# Patient Record
Sex: Female | Born: 1935 | Race: White | Hispanic: No | Smoking: Former smoker
Health system: Southern US, Community
[De-identification: ages and names within clinical notes are randomized; demographics above are authoritative.]

## PROBLEM LIST (undated history)

## (undated) DIAGNOSIS — J449 Chronic obstructive pulmonary disease, unspecified: Secondary | ICD-10-CM

## (undated) DIAGNOSIS — C801 Malignant (primary) neoplasm, unspecified: Secondary | ICD-10-CM

## (undated) HISTORY — PX: ABDOMINAL HYSTERECTOMY: SHX81

---

## 2020-10-25 ENCOUNTER — Emergency Department: Payer: Medicare PPO

## 2020-10-25 ENCOUNTER — Emergency Department
Admission: EM | Admit: 2020-10-25 | Discharge: 2020-10-25 | Disposition: A | Discharge status: Home / Self Care | Payer: Medicare PPO | Attending: Student in an Organized Health Care Education/Training Program | Admitting: Student in an Organized Health Care Education/Training Program

## 2020-10-25 ENCOUNTER — Other Ambulatory Visit: Payer: Self-pay

## 2020-10-25 DIAGNOSIS — R059 Cough, unspecified: Secondary | ICD-10-CM | POA: Insufficient documentation

## 2020-10-25 LAB — CBC WITH DIFFERENTIAL/PLATELET
Abs Immature Granulocytes: 0.05 10*3/uL (ref 0.00–0.07)
Basophils Absolute: 0 10*3/uL (ref 0.0–0.1)
Basophils Relative: 0 %
Eosinophils Absolute: 0 10*3/uL (ref 0.0–0.5)
Eosinophils Relative: 0 %
HCT: 32.3 % — ABNORMAL LOW (ref 36.0–46.0)
Hemoglobin: 10.8 g/dL — ABNORMAL LOW (ref 12.0–15.0)
Immature Granulocytes: 1 %
Lymphocytes Relative: 6 %
Lymphs Abs: 0.5 10*3/uL — ABNORMAL LOW (ref 0.7–4.0)
MCH: 32.1 pg (ref 26.0–34.0)
MCHC: 33.4 g/dL (ref 30.0–36.0)
MCV: 96.1 fL (ref 80.0–100.0)
Monocytes Absolute: 0.2 10*3/uL (ref 0.1–1.0)
Monocytes Relative: 2 %
Neutro Abs: 8.1 10*3/uL — ABNORMAL HIGH (ref 1.7–7.7)
Neutrophils Relative %: 91 %
Platelets: 439 10*3/uL — ABNORMAL HIGH (ref 150–400)
RBC: 3.36 MIL/uL — ABNORMAL LOW (ref 3.87–5.11)
RDW: 12.2 % (ref 11.5–15.5)
WBC: 8.9 10*3/uL (ref 4.0–10.5)
nRBC: 0 % (ref 0.0–0.2)

## 2020-10-25 LAB — COMPREHENSIVE METABOLIC PANEL
ALT: 9 U/L (ref 0–44)
AST: 16 U/L (ref 15–41)
Albumin: 2.9 g/dL — ABNORMAL LOW (ref 3.5–5.0)
Alkaline Phosphatase: 56 U/L (ref 38–126)
Anion gap: 11 (ref 5–15)
BUN: 23 mg/dL (ref 8–23)
CO2: 27 mmol/L (ref 22–32)
Calcium: 9.1 mg/dL (ref 8.9–10.3)
Chloride: 96 mmol/L — ABNORMAL LOW (ref 98–111)
Creatinine, Ser: 0.89 mg/dL (ref 0.44–1.00)
GFR, Estimated: >60 mL/min (ref >60)
Glucose, Bld: 155 mg/dL — ABNORMAL HIGH (ref 70–99)
Potassium: 4.4 mmol/L (ref 3.5–5.1)
Sodium: 134 mmol/L — ABNORMAL LOW (ref 135–145)
Total Bilirubin: 0.8 mg/dL (ref 0.3–1.2)
Total Protein: 7.6 g/dL (ref 6.5–8.1)

## 2020-10-25 LAB — URINALYSIS, COMPLETE (UACMP) WITH MICROSCOPIC
Bacteria, UA: NONE SEEN
Bilirubin Urine: NEGATIVE
Glucose, UA: NEGATIVE mg/dL
Hgb urine dipstick: NEGATIVE
Ketones, ur: 5 mg/dL — AB
Leukocytes,Ua: NEGATIVE
Nitrite: NEGATIVE
Protein, ur: NEGATIVE mg/dL
Specific Gravity, Urine: 1.01 (ref 1.005–1.030)
pH: 7 (ref 5.0–8.0)

## 2020-10-25 MED ORDER — SODIUM CHLORIDE 0.9 % IV BOLUS
500.0000 mL | Freq: Once | INTRAVENOUS | Status: AC
  Administered 2020-10-25: 500 mL via INTRAVENOUS

## 2020-10-25 NOTE — ED Notes (Signed)
Pt sitting in recliner chair between triage rooms. Pt states "why am I still here, why haven't I seen a doctor yet". Pt on oxygen, pt has approx 3/4 tank of oxygen left in tank. Pt updated on wait time and informed that she is currently waiting for a room to become available to be seen by MD

## 2020-10-25 NOTE — ED Triage Notes (Signed)
Pt to ED via [facility] from [place], per EMS report pt son is not happy with facility she is at and he is concerned that she is malnourished. Pt was recently given antibiotics due to cough, unsure of where she is in the abx course. Pt reports that she is dehydrated and needs fluids. Pt is c/o being cold. VSS at this time. Pt is A & O x 3

## 2020-10-25 NOTE — Discharge Instructions (Signed)
Please follow-up with your PCP in the next week.  If unable to be transported to PCP patient should be seen by doctors making house calls with family present discuss her medications and care.  Please return to the ER if you have any worsening symptoms any additional questions or concerns.

## 2020-10-25 NOTE — ED Provider Notes (Signed)
[facility] Emergency Department Provider Note    First MD Initiated Contact with Patient 10/25/20 1930     (approximate)  I have reviewed the triage vital signs and the nursing notes.   HISTORY  Chief Complaint Cough    HPI Tara Middleton is a 86 y.o. female presents to the ER for evaluation of generalized malaise and cough as well as wants to be evaluated for some skin breakdown in her upper buttock.  She is coming from facility there is some report by EMS that the family is unhappy with her facility currently.  She denies any worsening shortness of breath.  States that she was recently put on antibiotics for recent cough.  Denies any fevers.  Denies any chest pain.  Feels like her breathing is roughly at his baseline.  Denies any abdominal pain.  Does feel like she is dehydrated and has had decreased p.o. intake.  Denies any abdominal pain.    No past medical history on file. No family history on file.  There are no problems to display for this patient.     Prior to Admission medications   Not on File    Allergies Amoxicillin-pot clavulanate, Cefaclor, Cefuroxime sodium-nacl, Clindamycin/lincomycin, Codeine, Dicloxacillin, Lisinopril, Nabumetone, Promethazine, Remeron [mirtazapine], and Sulfa antibiotics    Social History Social History   Tobacco Use  . Smoking status: Not on file  Substance Use Topics  . Alcohol use: Not on file  . Drug use: Not on file    Review of Systems Patient denies headaches, rhinorrhea, blurry vision, numbness, shortness of breath, chest pain, edema, cough, abdominal pain, nausea, vomiting, diarrhea, dysuria, fevers, rashes or hallucinations unless otherwise stated above in HPI. ____________________________________________   PHYSICAL EXAM:  VITAL SIGNS: Vitals:   10/25/20 1441  BP: (!) 136/50  Pulse: 73  Resp: 16  Temp: 98.5 F (36.9 C)  SpO2: 96%    Constitutional: Alert and oriented. Frail  appearing, in NAD Eyes: Conjunctivae are normal.  Head: Atraumatic. Nose: No congestion/rhinnorhea. Mouth/Throat: Mucous membranes are moist.   Neck: No stridor. Painless ROM.  Cardiovascular: Normal rate, regular rhythm. Grossly normal heart sounds.  Good peripheral circulation. Respiratory: Normal respiratory effort.  No retractions. Lungs with coarse bs throughout Gastrointestinal: Soft and nontender. No distention. No abdominal bruits. No CVA tenderness. Genitourinary:  Musculoskeletal: stage sacral decubitus ulcer.  No lower extremity tenderness nor edema.  No joint effusions. Neurologic:  Normal speech and language. No gross focal neurologic deficits are appreciated. No facial droop Skin:  Skin is warm, dry and intact. No rash noted. Psychiatric: Mood and affect are normal. Speech and behavior are normal.  ____________________________________________   LABS (all labs ordered are listed, but only abnormal results are displayed)  Results for orders placed or performed during the hospital encounter of 10/25/20 (from the past 24 hour(s))  CBC with Differential     Status: Abnormal   Collection Time: 10/25/20  2:48 PM  Result Value Ref Range   WBC 8.9 4.0 - 10.5 K/uL   RBC 3.36 (L) 3.87 - 5.11 MIL/uL   Hemoglobin 10.8 (L) 12.0 - 15.0 g/dL   HCT 32.3 (L) 36 - 46 %   MCV 96.1 80.0 - 100.0 fL   MCH 32.1 26.0 - 34.0 pg   MCHC 33.4 30.0 - 36.0 g/dL   RDW 12.2 11.5 - 15.5 %   Platelets 439 (H) 150 - 400 K/uL   nRBC 0.0 0.0 - 0.2 %   Neutrophils Relative % 91 %  Neutro Abs 8.1 (H) 1.7 - 7.7 K/uL   Lymphocytes Relative 6 %   Lymphs Abs 0.5 (L) 0.7 - 4.0 K/uL   Monocytes Relative 2 %   Monocytes Absolute 0.2 0.1 - 1.0 K/uL   Eosinophils Relative 0 %   Eosinophils Absolute 0.0 0.0 - 0.5 K/uL   Basophils Relative 0 %   Basophils Absolute 0.0 0.0 - 0.1 K/uL   Immature Granulocytes 1 %   Abs Immature Granulocytes 0.05 0.00 - 0.07 K/uL  Comprehensive metabolic panel     Status:  Abnormal   Collection Time: 10/25/20  2:48 PM  Result Value Ref Range   Sodium 134 (L) 135 - 145 mmol/L   Potassium 4.4 3.5 - 5.1 mmol/L   Chloride 96 (L) 98 - 111 mmol/L   CO2 27 22 - 32 mmol/L   Glucose, Bld 155 (H) 70 - 99 mg/dL   BUN 23 8 - 23 mg/dL   Creatinine, Ser 0.89 0.44 - 1.00 mg/dL   Calcium 9.1 8.9 - 10.3 mg/dL   Total Protein 7.6 6.5 - 8.1 g/dL   Albumin 2.9 (L) 3.5 - 5.0 g/dL   AST 16 15 - 41 U/L   ALT 9 0 - 44 U/L   Alkaline Phosphatase 56 38 - 126 U/L   Total Bilirubin 0.8 0.3 - 1.2 mg/dL   GFR, Estimated >60 >60 mL/min   Anion gap 11 5 - 15   ____________________________________________  EKG My review and personal interpretation at Time: 14:32   Indication: cough  Rate: 70  Rhythm: sinus Axis: normal Other: normal intervals, no stemi ____________________________________________  RADIOLOGY  I personally reviewed all radiographic images ordered to evaluate for the above acute complaints and reviewed radiology reports and findings.  These findings were personally discussed with the patient.  Please see medical record for radiology report.  ____________________________________________   PROCEDURES  Procedure(s) performed:  Procedures    Critical Care performed: no ____________________________________________   INITIAL IMPRESSION / ASSESSMENT AND PLAN / ED COURSE  Pertinent labs & imaging results that were available during my care of the patient were reviewed by me and considered in my medical decision making (see chart for details).   DDX: pna, copd, chf, electrolyte abn, dehydration, ulcer  Tara Middleton is a 86 y.o. who presents to the ED with presentation as described above.  Patient is nontoxic-appearing.  Afebrile no hypoxia on her home O2.  Blood work was sent for the above differential order x-rays.  She not complain of any abdominal pain.  No recent falls.  Family is concerned about some increasing anxiousness as well as episodes of possible  confusion the past several weeks.  Will check urinalysis.  Does not seem to be having COPD exacerbation.  May be related to polypharmacy as she is on multiple medications including pain medications.  Clinical Course as of Oct 26 13  Fri Oct 25, 2020  2145 Patient has no abdominal pain.  No nausea or vomiting.  Suspect gaseous distention not consistent with ileus not consistent with obstruction.  Family is reporting some increasing confusion particularly at night is reporting that she has been on chronic pain medication but also stating the patient becomes very anxious at night.   [PR]  2207 Patient reassessed.  Otherwise feels well after IV fluids.  Tolerating p.o.  I discussed with family it seems like she has had some decline since no longer getting PT.  Will place consult for case management but I do not see indication  for hospitalization at this time.  Patient appropriate for close outpatient follow-up.   [PR]    Clinical Course User Index [PR] Merlyn Lot, MD    The patient was evaluated in Emergency Department today for the symptoms described in the history of present illness. He/she was evaluated in the context of the global COVID-19 pandemic, which necessitated consideration that the patient might be at risk for infection with the SARS-CoV-2 virus that causes COVID-19. Institutional protocols and algorithms that pertain to the evaluation of patients at risk for COVID-19 are in a state of rapid change based on information released by regulatory bodies including the CDC and federal and state organizations. These policies and algorithms were followed during the patient's care in the ED.  As part of my medical decision making, I reviewed the following data within the [place] notes reviewed and incorporated, Labs reviewed, notes from prior ED visits and [place] Controlled Substance Database   ____________________________________________   FINAL CLINICAL  IMPRESSION(S) / ED DIAGNOSES  Final diagnoses:  Cough      NEW MEDICATIONS STARTED DURING THIS VISIT:  New Prescriptions   No medications on file     Note:  This document was prepared using Dragon voice recognition software and may include unintentional dictation errors.    Merlyn Lot, MD 10/26/20 [id number]

## 2020-10-25 NOTE — ED Notes (Addendum)
Tara Middleton at [facility] [employer] [facility] [employer] [facility] contacted at [phone].  Advised that ER MD want PT initiated, wound care initiated and within 48 hours a family meeting with the doctor to discuss medications.  Tara Middleton verbalized understanding and had no further questions

## 2020-10-28 NOTE — ED Notes (Signed)
Called [facility] and informed Tara Middleton that chest xray shows nodular density and the recommendation for follow up chest xray in 4-6 weeks and instructed her to notify patient's physician.

## 2020-11-08 ENCOUNTER — Inpatient Hospital Stay
Admission: EM | Admit: 2020-11-08 | Discharge: 2020-11-10 | DRG: 603 | Disposition: A | Discharge status: Skilled Nursing Facility | Payer: Medicare Other | Source: Skilled Nursing Facility | Attending: Internal Medicine | Admitting: Internal Medicine

## 2020-11-08 ENCOUNTER — Other Ambulatory Visit: Payer: Self-pay

## 2020-11-08 DIAGNOSIS — L03115 Cellulitis of right lower limb: Secondary | ICD-10-CM | POA: Diagnosis not present

## 2020-11-08 DIAGNOSIS — Z9012 Acquired absence of left breast and nipple: Secondary | ICD-10-CM

## 2020-11-08 DIAGNOSIS — J9610 Chronic respiratory failure, unspecified whether with hypoxia or hypercapnia: Secondary | ICD-10-CM | POA: Diagnosis not present

## 2020-11-08 DIAGNOSIS — Z882 Allergy status to sulfonamides status: Secondary | ICD-10-CM

## 2020-11-08 DIAGNOSIS — L89311 Pressure ulcer of right buttock, stage 1: Secondary | ICD-10-CM | POA: Diagnosis present

## 2020-11-08 DIAGNOSIS — Z853 Personal history of malignant neoplasm of breast: Secondary | ICD-10-CM | POA: Diagnosis not present

## 2020-11-08 DIAGNOSIS — L89321 Pressure ulcer of left buttock, stage 1: Secondary | ICD-10-CM | POA: Diagnosis present

## 2020-11-08 DIAGNOSIS — L03116 Cellulitis of left lower limb: Secondary | ICD-10-CM | POA: Diagnosis not present

## 2020-11-08 DIAGNOSIS — Z9981 Dependence on supplemental oxygen: Secondary | ICD-10-CM

## 2020-11-08 DIAGNOSIS — Z947 Corneal transplant status: Secondary | ICD-10-CM

## 2020-11-08 DIAGNOSIS — Z888 Allergy status to other drugs, medicaments and biological substances status: Secondary | ICD-10-CM | POA: Diagnosis not present

## 2020-11-08 DIAGNOSIS — Z87891 Personal history of nicotine dependence: Secondary | ICD-10-CM | POA: Diagnosis not present

## 2020-11-08 DIAGNOSIS — J449 Chronic obstructive pulmonary disease, unspecified: Secondary | ICD-10-CM | POA: Diagnosis not present

## 2020-11-08 DIAGNOSIS — Z881 Allergy status to other antibiotic agents status: Secondary | ICD-10-CM | POA: Diagnosis not present

## 2020-11-08 DIAGNOSIS — L03119 Cellulitis of unspecified part of limb: Secondary | ICD-10-CM | POA: Diagnosis present

## 2020-11-08 DIAGNOSIS — Z885 Allergy status to narcotic agent status: Secondary | ICD-10-CM

## 2020-11-08 DIAGNOSIS — Z20822 Contact with and (suspected) exposure to covid-19: Secondary | ICD-10-CM | POA: Diagnosis not present

## 2020-11-08 DIAGNOSIS — I10 Essential (primary) hypertension: Secondary | ICD-10-CM | POA: Diagnosis present

## 2020-11-08 DIAGNOSIS — L8990 Pressure ulcer of unspecified site, unspecified stage: Secondary | ICD-10-CM | POA: Insufficient documentation

## 2020-11-08 HISTORY — DX: Chronic obstructive pulmonary disease, unspecified: J44.9

## 2020-11-08 HISTORY — DX: Malignant (primary) neoplasm, unspecified: C80.1

## 2020-11-08 LAB — CBC WITH DIFFERENTIAL/PLATELET
Abs Immature Granulocytes: 0.02 10*3/uL (ref 0.00–0.07)
Basophils Absolute: 0.1 10*3/uL (ref 0.0–0.1)
Basophils Relative: 1 %
Eosinophils Absolute: 0.1 10*3/uL (ref 0.0–0.5)
Eosinophils Relative: 2 %
HCT: 33.3 % — ABNORMAL LOW (ref 36.0–46.0)
Hemoglobin: 10.9 g/dL — ABNORMAL LOW (ref 12.0–15.0)
Immature Granulocytes: 0 %
Lymphocytes Relative: 13 %
Lymphs Abs: 0.9 10*3/uL (ref 0.7–4.0)
MCH: 32 pg (ref 26.0–34.0)
MCHC: 32.7 g/dL (ref 30.0–36.0)
MCV: 97.7 fL (ref 80.0–100.0)
Monocytes Absolute: 0.5 10*3/uL (ref 0.1–1.0)
Monocytes Relative: 7 %
Neutro Abs: 5.2 10*3/uL (ref 1.7–7.7)
Neutrophils Relative %: 77 %
Platelets: 322 10*3/uL (ref 150–400)
RBC: 3.41 MIL/uL — ABNORMAL LOW (ref 3.87–5.11)
RDW: 13.6 % (ref 11.5–15.5)
WBC: 6.8 10*3/uL (ref 4.0–10.5)
nRBC: 0 % (ref 0.0–0.2)

## 2020-11-08 LAB — COMPREHENSIVE METABOLIC PANEL
ALT: 9 U/L (ref 0–44)
AST: 18 U/L (ref 15–41)
Albumin: 3 g/dL — ABNORMAL LOW (ref 3.5–5.0)
Alkaline Phosphatase: 68 U/L (ref 38–126)
Anion gap: 11 (ref 5–15)
BUN: 32 mg/dL — ABNORMAL HIGH (ref 8–23)
CO2: 25 mmol/L (ref 22–32)
Calcium: 8.8 mg/dL — ABNORMAL LOW (ref 8.9–10.3)
Chloride: 102 mmol/L (ref 98–111)
Creatinine, Ser: 0.65 mg/dL (ref 0.44–1.00)
GFR, Estimated: >60 mL/min (ref >60)
Glucose, Bld: 108 mg/dL — ABNORMAL HIGH (ref 70–99)
Potassium: 4.2 mmol/L (ref 3.5–5.1)
Sodium: 138 mmol/L (ref 135–145)
Total Bilirubin: 0.7 mg/dL (ref 0.3–1.2)
Total Protein: 7.1 g/dL (ref 6.5–8.1)

## 2020-11-08 LAB — RESP PANEL BY RT-PCR (FLU A&B, COVID) ARPGX2
Influenza A by PCR: NEGATIVE
Influenza B by PCR: NEGATIVE
SARS Coronavirus 2 by RT PCR: NEGATIVE

## 2020-11-08 LAB — PROTIME-INR
INR: 0.9 (ref 0.8–1.2)
Prothrombin Time: 12 seconds (ref 11.4–15.2)

## 2020-11-08 LAB — APTT: aPTT: 34 seconds (ref 24–36)

## 2020-11-08 MED ORDER — SODIUM CHLORIDE 0.9 % IV SOLN
1.0000 g | Freq: Two times a day (BID) | INTRAVENOUS | Status: DC
  Administered 2020-11-09: 08:00:00 1 g via INTRAVENOUS
  Filled 2020-11-08 (×2): qty 1

## 2020-11-08 MED ORDER — PANTOPRAZOLE SODIUM 40 MG PO TBEC
40.0000 mg | DELAYED_RELEASE_TABLET | Freq: Every day | ORAL | Status: DC
  Administered 2020-11-09 – 2020-11-10 (×2): 40 mg via ORAL
  Filled 2020-11-08 (×2): qty 1

## 2020-11-08 MED ORDER — LEVOTHYROXINE SODIUM 112 MCG PO TABS: 112.0000 ug | ORAL_TABLET | Freq: Every day | ORAL | Status: DC

## 2020-11-08 MED ORDER — TIOTROPIUM BROMIDE MONOHYDRATE 2.5 MCG/ACT IN AERS: 2.0000 | INHALATION_SPRAY | Freq: Every day | RESPIRATORY_TRACT | Status: DC

## 2020-11-08 MED ORDER — VITAMIN D3 25 MCG (1000 UNIT) PO TABS
1000.0000 [IU] | ORAL_TABLET | Freq: Every day | ORAL | Status: DC
  Administered 2020-11-09 – 2020-11-10 (×2): 1000 [IU] via ORAL
  Filled 2020-11-08 (×4): qty 1

## 2020-11-08 MED ORDER — VANCOMYCIN HCL IN DEXTROSE 1-5 GM/200ML-% IV SOLN: 1000.0000 mg | Freq: Once | INTRAVENOUS | Status: DC

## 2020-11-08 MED ORDER — VANCOMYCIN HCL 1750 MG/350ML IV SOLN
1750.0000 mg | Freq: Once | INTRAVENOUS | Status: AC
  Administered 2020-11-08: 20:00:00 1750 mg via INTRAVENOUS
  Filled 2020-11-08: qty 350

## 2020-11-08 MED ORDER — ACIDOPHILUS PO TABS: 1.0000 | ORAL_TABLET | Freq: Two times a day (BID) | ORAL | Status: DC

## 2020-11-08 MED ORDER — ALBUTEROL SULFATE 0.63 MG/3ML IN NEBU: 3.0000 mL | INHALATION_SOLUTION | Freq: Three times a day (TID) | RESPIRATORY_TRACT | Status: DC | PRN

## 2020-11-08 MED ORDER — LOTEPREDNOL ETABONATE 0.5 % OP SUSP
1.0000 [drp] | Freq: Every day | OPHTHALMIC | Status: DC
  Administered 2020-11-10: 08:00:00 1 [drp] via OPHTHALMIC
  Filled 2020-11-08: qty 5

## 2020-11-08 MED ORDER — ALBUTEROL SULFATE (2.5 MG/3ML) 0.083% IN NEBU
0.6300 mg | INHALATION_SOLUTION | Freq: Three times a day (TID) | RESPIRATORY_TRACT | Status: DC | PRN
  Filled 2020-11-08: qty 3

## 2020-11-08 MED ORDER — AMLODIPINE BESYLATE 5 MG PO TABS
5.0000 mg | ORAL_TABLET | Freq: Every day | ORAL | Status: DC
  Administered 2020-11-09 – 2020-11-10 (×2): 5 mg via ORAL
  Filled 2020-11-08 (×2): qty 1

## 2020-11-08 MED ORDER — HYDROCODONE-ACETAMINOPHEN 5-325 MG PO TABS
1.0000 | ORAL_TABLET | Freq: Four times a day (QID) | ORAL | Status: DC | PRN
  Administered 2020-11-09 – 2020-11-10 (×2): 1 via ORAL
  Filled 2020-11-08 (×2): qty 1

## 2020-11-08 MED ORDER — UMECLIDINIUM BROMIDE 62.5 MCG/INH IN AEPB
1.0000 | INHALATION_SPRAY | Freq: Every day | RESPIRATORY_TRACT | Status: DC
  Administered 2020-11-10: 08:00:00 1 via RESPIRATORY_TRACT
  Filled 2020-11-08: qty 7

## 2020-11-08 MED ORDER — BUDESONIDE 0.5 MG/2ML IN SUSP
0.5000 mg | Freq: Two times a day (BID) | RESPIRATORY_TRACT | Status: DC
  Administered 2020-11-09 – 2020-11-10 (×3): 0.5 mg via RESPIRATORY_TRACT
  Filled 2020-11-08 (×2): qty 2

## 2020-11-08 MED ORDER — SODIUM CHLORIDE 0.9 % IV SOLN: 250.0000 mL | INTRAVENOUS | Status: DC | PRN

## 2020-11-08 MED ORDER — LIQUACEL PO LIQD: 30.0000 mL | Freq: Two times a day (BID) | ORAL | Status: DC

## 2020-11-08 MED ORDER — SODIUM CHLORIDE 0.9% FLUSH
3.0000 mL | Freq: Two times a day (BID) | INTRAVENOUS | Status: DC
  Administered 2020-11-09 – 2020-11-10 (×3): 3 mL via INTRAVENOUS

## 2020-11-08 MED ORDER — SODIUM CHLORIDE 0.9 % IV SOLN
1.0000 g | Freq: Once | INTRAVENOUS | Status: AC
  Administered 2020-11-09: 03:00:00 1 g via INTRAVENOUS
  Filled 2020-11-08: qty 1

## 2020-11-08 MED ORDER — RISAQUAD PO CAPS
1.0000 | ORAL_CAPSULE | Freq: Every day | ORAL | Status: DC
  Administered 2020-11-09 – 2020-11-10 (×2): 1 via ORAL
  Filled 2020-11-08 (×2): qty 1

## 2020-11-08 MED ORDER — MAGNESIUM OXIDE 400 MG PO TABS
400.0000 mg | ORAL_TABLET | Freq: Every day | ORAL | Status: DC
  Administered 2020-11-09 – 2020-11-10 (×2): 400 mg via ORAL
  Filled 2020-11-08 (×4): qty 1

## 2020-11-08 MED ORDER — POLYETHYLENE GLYCOL 3350 17 G PO PACK
17.0000 g | PACK | Freq: Every day | ORAL | Status: DC
  Administered 2020-11-09 – 2020-11-10 (×2): 17 g via ORAL
  Filled 2020-11-08 (×2): qty 1

## 2020-11-08 MED ORDER — DORZOLAMIDE HCL 2 % OP SOLN
1.0000 [drp] | Freq: Two times a day (BID) | OPHTHALMIC | Status: DC
  Administered 2020-11-09: 08:00:00 1 [drp] via OPHTHALMIC
  Filled 2020-11-08 (×2): qty 10

## 2020-11-08 MED ORDER — TIMOLOL MALEATE 0.5 % OP SOLN
1.0000 [drp] | Freq: Two times a day (BID) | OPHTHALMIC | Status: DC
  Administered 2020-11-09 – 2020-11-10 (×3): 1 [drp] via OPHTHALMIC
  Filled 2020-11-08 (×2): qty 5

## 2020-11-08 MED ORDER — POLYETHYLENE GLYCOL 3350 17 GM/SCOOP PO POWD: 17.0000 g | Freq: Every day | ORAL | Status: DC

## 2020-11-08 MED ORDER — DORZOLAMIDE HCL-TIMOLOL MAL 2-0.5 % OP SOLN: 1.0000 [drp] | Freq: Two times a day (BID) | OPHTHALMIC | Status: DC

## 2020-11-08 MED ORDER — SODIUM CHLORIDE 0.9% FLUSH: 3.0000 mL | INTRAVENOUS | Status: DC | PRN

## 2020-11-08 MED ORDER — URSODIOL 300 MG PO CAPS
300.0000 mg | ORAL_CAPSULE | Freq: Two times a day (BID) | ORAL | Status: DC
  Administered 2020-11-09 – 2020-11-10 (×3): 300 mg via ORAL
  Filled 2020-11-08 (×6): qty 1

## 2020-11-08 MED ORDER — VANCOMYCIN HCL 1500 MG/300ML IV SOLN
1500.0000 mg | INTRAVENOUS | Status: DC
  Filled 2020-11-08: qty 300

## 2020-11-08 MED ORDER — FLUTICASONE PROPIONATE HFA 220 MCG/ACT IN AERO: 2.0000 | INHALATION_SPRAY | Freq: Two times a day (BID) | RESPIRATORY_TRACT | Status: DC

## 2020-11-08 MED ORDER — LATANOPROST 0.005 % OP SOLN
1.0000 [drp] | Freq: Every day | OPHTHALMIC | Status: DC
  Administered 2020-11-09: 21:00:00 1 [drp] via OPHTHALMIC
  Filled 2020-11-08 (×2): qty 2.5

## 2020-11-08 MED ORDER — ENOXAPARIN SODIUM 40 MG/0.4ML ~~LOC~~ SOLN: 40.0000 mg | SUBCUTANEOUS | Status: DC

## 2020-11-08 MED ORDER — HYDROCODONE-ACETAMINOPHEN 5-325 MG PO TABS
1.0000 | ORAL_TABLET | Freq: Once | ORAL | Status: AC
  Administered 2020-11-08: 20:00:00 1 via ORAL
  Filled 2020-11-08: qty 1

## 2020-11-08 NOTE — ED Triage Notes (Signed)
First RN Note: Pt to ED via [facility] with c/o blisters that are filled with blood to L inner thigh. Per EMS pt with redness to L inner thigh down to L calf with 2 very large blisters noted to inner calf.   Per EMS pt wih no use to L arm due to hx of cancer.

## 2020-11-08 NOTE — Progress Notes (Signed)
Pharmacy Antibiotic Note  Tara Middleton is a 86 y.o. female admitted on 11/08/2020 with cellulitis.  Pharmacy has been consulted for Meropenem and Vancomycin dosing.  Plan:  Vancomycin 1750mg  IV bolus followed by 1500mg  IV q24h  Meropenem 1g IV q12h  Vancomycin trough at steady state  Follow renal function  Height: 5\' 3"  (160 cm) Weight: 84.4 kg (186 lb) IBW/kg (Calculated) : 52.4  Temp (24hrs), Avg:98.3 F (36.8 C), Min:98.3 F (36.8 C), Max:98.3 F (36.8 C)  Recent Labs  Lab 11/08/20 1309  WBC 6.8  CREATININE 0.65    Estimated Creatinine Clearance: 49.1 mL/min (by C-G formula based on SCr of 0.65 mg/dL).    Allergies  Allergen Reactions  . Amoxicillin-Pot Clavulanate   . Cefaclor   . Cefuroxime Sodium-Nacl   . Clindamycin/Lincomycin   . Codeine   . Dicloxacillin   . Lisinopril   . Nabumetone   . Promethazine   . Remeron [Mirtazapine]   . Sulfa Antibiotics     Antimicrobials this admission: Meropenem 12/17 >>  Vancomycin 12/17 >>   Dose adjustments this admission:  Microbiology results:  Thank you for allowing pharmacy to be a part of this patient's care.  Paulina Fusi, PharmD, BCPS 11/08/2020 9:33 PM

## 2020-11-08 NOTE — ED Provider Notes (Signed)
[facility] Emergency Department Provider Note   ____________________________________________   Event Date/Time   First MD Initiated Contact with Patient 11/08/20 1755     (approximate)  I have reviewed the triage vital signs and the nursing notes.   HISTORY  Chief Complaint Leg Pain    HPI Tara Middleton is a 86 y.o. female history of COPD  Patient presents today, reports about 1 week having pain in her lower legs with bruising.  This is developed into blistering and redness and is very tender to the touch in both lower legs.  No rashes or bruising or blistering elsewhere.  No new medications  Reports it is very painful treating with hydrocodone   Pain is mild at rest but severe if she touches the skin.  Is very red.  No nausea vomiting no fevers.  No chills.  She does walk but is painful with anything that touches her lower legs  No cough or shortness of breath.  Past Medical History:  Diagnosis Date  . Cancer ([place])   . COPD (chronic obstructive pulmonary disease) [facility])     Patient Active Problem List   Diagnosis Date Noted  . Cellulitis of lower extremity 11/08/2020    Past Surgical History:  Procedure Laterality Date  . ABDOMINAL HYSTERECTOMY      Prior to Admission medications   Not on File    Allergies Amoxicillin-pot clavulanate, Cefaclor, Cefuroxime sodium-nacl, Clindamycin/lincomycin, Codeine, Dicloxacillin, Lisinopril, Nabumetone, Promethazine, Remeron [mirtazapine], and Sulfa antibiotics  History reviewed. No pertinent family history.  Social History Social History   Tobacco Use  . Smoking status: Former Smoker  Substance Use Topics  . Alcohol use: Never  . Drug use: Never    Review of Systems Constitutional: No fever/chills Eyes: No visual changes. ENT: No sore throat. Cardiovascular: Denies chest pain. Respiratory: Denies shortness of breath. Gastrointestinal: No abdominal pain.   Genitourinary: Negative for  dysuria. Musculoskeletal: Negative for back pain. Skin: See HPI Neurological: Negative for headaches.  No numbness weakness or cold or blue feet bilateral    ____________________________________________   PHYSICAL EXAM:  VITAL SIGNS: ED Triage Vitals  Enc Vitals Group     BP 11/08/20 1304 (!) 109/54     Pulse Rate 11/08/20 1304 65     Resp 11/08/20 1304 18     Temp 11/08/20 1304 98.3 F (36.8 C)     Temp Source 11/08/20 1304 Oral     SpO2 11/08/20 1304 100 %     Weight 11/08/20 1305 186 lb (84.4 kg)     Height 11/08/20 1305 5\' 3"  (1.6 m)     Head Circumference --      Peak Flow --      Pain Score 11/08/20 1304 0     Pain Loc --      Pain Edu? --      Excl. in [place]? --     Constitutional: Alert and oriented.  Very frail and chronically ill appearance Eyes: Conjunctivae are normal. Head: Atraumatic. Nose: No congestion/rhinnorhea. Mouth/Throat: Mucous membranes are moist. Neck: No stridor.  Cardiovascular: Normal rate, regular rhythm. Grossly normal heart sounds.  Good peripheral circulation. Respiratory: Normal respiratory effort.  No retractions. Lungs CTAB. Gastrointestinal: Soft and nontender. No distention. Musculoskeletal: Bilateral lower extremity redness with somewhat bolus venous blistering and small patches over the lower legs bilaterally with one fairly large blister over the left posterior calf.  She has surrounding erythema warmth and tenderness to touch in both lower extremities.  There are no open blisters or ulcerations.  Skin rashes seem to be isolated between the ankles and knees bilaterally without extension to other areas or extremities.  Strong dorsalis pedis and posterior tibial pulses bilateral.  Normal sensation and toe wiggle bilateral Neurologic:  Normal speech and language. No gross focal neurologic deficits are appreciated.  Skin:  Skin is warm, dry and intact.  See above under musculoskeletal psychiatric: Mood and affect are normal. Speech and  behavior are normal.  ____________________________________________   LABS (all labs ordered are listed, but only abnormal results are displayed)  Labs Reviewed  CBC WITH DIFFERENTIAL/PLATELET - Abnormal; Notable for the following components:      Result Value   RBC 3.41 (*)    Hemoglobin 10.9 (*)    HCT 33.3 (*)    All other components within normal limits  COMPREHENSIVE METABOLIC PANEL - Abnormal; Notable for the following components:   Glucose, Bld 108 (*)    BUN 32 (*)    Calcium 8.8 (*)    Albumin 3.0 (*)    All other components within normal limits  CULTURE, BLOOD (ROUTINE X 2)  CULTURE, BLOOD (ROUTINE X 2)  APTT  PROTIME-INR   ____________________________________________  EKG   ____________________________________________  RADIOLOGY   ____________________________________________   PROCEDURES  Procedure(s) performed: None  Procedures  Critical Care performed: No  ____________________________________________   INITIAL IMPRESSION / ASSESSMENT AND PLAN / ED COURSE  Pertinent labs & imaging results that were available during my care of the patient were reviewed by me and considered in my medical decision making (see chart for details).   Patient presents with venous appearing blistering, possible venous congestion with associated skin blistering unclear from microtrauma or other etiology such as inflammatory or immune etiology though given tenderness warmth and surrounding erythema I favor venous congestion with associated bilateral cellulitic changes.  Discussed with the patient and family will monitor order wound consult and start the patient on IV vancomycin  Admission discussed with Dr. Francine Graven.  Patient and family agreeable.  Had last took hydrocodone in her facility about 6 to 8 hours ago and would like another dose which is effective for pain relief.  Tara Middleton was evaluated in Emergency Department on 11/08/2020 for the symptoms described in the  history of present illness. She was evaluated in the context of the global COVID-19 pandemic, which necessitated consideration that the patient might be at risk for infection with the SARS-CoV-2 virus that causes COVID-19. Institutional protocols and algorithms that pertain to the evaluation of patients at risk for COVID-19 are in a state of rapid change based on information released by regulatory bodies including the CDC and federal and state organizations. These policies and algorithms were followed during the patient's care in the ED.       ____________________________________________   FINAL CLINICAL IMPRESSION(S) / ED DIAGNOSES  Final diagnoses:  Cellulitis of lower extremity, unspecified laterality        Note:  This document was prepared using Dragon voice recognition software and may include unintentional dictation errors       Delman Kitten, MD 11/08/20 1846

## 2020-11-08 NOTE — ED Triage Notes (Signed)
Pt here via [facility] from [facility].  Pt with c/o blood filled blisters to bilateral lower extremities. Redness and swelling present down L inner thigh to L calf with apple sized blister present to medial inner calf. No drainage. Pt reports pain with ambulation, extremely sensitive to the touch.

## 2020-11-08 NOTE — Progress Notes (Signed)
PHARMACY -  BRIEF ANTIBIOTIC NOTE   Pharmacy has received consult(s) for Vancomycin from an ED provider.  The patient's profile has been reviewed for ht/wt/allergies/indication/available labs.    One time order(s) placed for Vancomycin 1750mg    Further antibiotics/pharmacy consults should be ordered by admitting physician if indicated.                       Thank you, Vira Blanco 11/08/2020  6:27 PM

## 2020-11-08 NOTE — H&P (Signed)
History and Physical    Tara Middleton GXQ:[id number] DOB: [birth year] DOA: 11/08/2020  PCP: Patient, No Pcp Per   Patient coming from: Skilled nursing facility   I have personally briefly reviewed patient's old medical records in [place]  Chief Complaint: Bilateral lower extremity pain  HPI: Tara Middleton is a 86 y.o. female with medical history significant for COPD with chronic respiratory failure, history of breast cancer status post left mastectomy who presents to the emergency room via EMS for evaluation of pain in both legs.  Patient states that she has had pain in both lower extremities for months but recently started developing blisters on both legs and over the last several days she noticed that the blisters contained blood.  Blisters are painful and are very tender to touch.  She denies any trauma, denies any recent fall, no recent medication use or change in lotions/cream.  She denies having any fever or chills, no dizziness, no lightheadedness, no chest pain, no shortness of breath, no palpitations, no diaphoresis, no abdominal pain, no nausea, no vomiting, no changes in her bowel habits, no cough, no headache or any mental status changes. Her daughter-in-law who is at the bedside states that she has had very poor oral intake and has lost a lot of weight, over 20 pounds in the last couple of months. Labs show sodium 138, potassium 4.2, chloride 102, bicarb 25, glucose 108, BUN 32, creatinine 0.65 calcium 8.8, alkaline phosphatase 68, albumin 3.0, AST 18, ALT nine, total protein 7.1, white count 6.8, hemoglobin 10.9, hematocrit 33.3, MCV 97.7, RDW 13.6, platelet count 322, PT 12.0, INR 0.9 Urinalysis is sterile     ED Course: Patient is an 86 year old Caucasian female who presents to the emergency room for evaluation of pain in both lower extremities.  She is noted to have  bullous lesion on both lower extremities that are very tender to touch.  She has redness over both lower  extremities with differential warmth and received IV antibiotics in the ER for presumed cellulitis.  She will be admitted to the hospital for further evaluation.  Review of Systems: As per HPI otherwise all negative.    Past Medical History:  Diagnosis Date  . Cancer ([place])   . COPD (chronic obstructive pulmonary disease) ([place])     Past Surgical History:  Procedure Laterality Date  . ABDOMINAL HYSTERECTOMY       reports that she has quit smoking. She does not have any smokeless tobacco history on file. She reports that she does not drink alcohol and does not use drugs.  Allergies  Allergen Reactions  . Amoxicillin-Pot Clavulanate   . Cefaclor   . Cefuroxime Sodium-Nacl   . Clindamycin/Lincomycin   . Codeine   . Dicloxacillin   . Lisinopril   . Nabumetone   . Promethazine   . Remeron [Mirtazapine]   . Sulfa Antibiotics     History reviewed. No pertinent family history.   Prior to Admission medications   Not on File    Physical Exam: Vitals:   11/08/20 1304 11/08/20 1305 11/08/20 1634  BP: (!) 109/54  (!) 134/56  Pulse: 65  68  Resp: 18  20  Temp: 98.3 F (36.8 C)    TempSrc: Oral    SpO2: 100%  100%  Weight:  84.4 kg   Height:  5\' 3"  (1.6 m)      Vitals:   11/08/20 1304 11/08/20 1305 11/08/20 1634  BP: (!) 109/54  (!) 134/56  Pulse: 65  68  Resp: 18  20  Temp: 98.3 F (36.8 C)    TempSrc: Oral    SpO2: 100%  100%  Weight:  84.4 kg   Height:  5\' 3"  (1.6 m)     Constitutional: NAD, alert and oriented x 3.  Frail Eyes: PERRL, lids and conjunctivae pallor ENMT: Mucous membranes are moist.  Neck: normal, supple, no masses, no thyromegaly Respiratory: clear to auscultation bilaterally, no wheezing, no crackles. Normal respiratory effort. No accessory muscle use.  Cardiovascular: Regular rate and rhythm,no murmurs / rubs / gallops. No extremity edema. 2+ pedal pulses. No carotid bruits.  Abdomen: no tenderness, no masses palpated. No  hepatosplenomegaly. Bowel sounds positive.  Musculoskeletal: no clubbing / cyanosis. No joint deformity upper and lower extremities.  Skin: Redness over both lower extremities with differential warmth, blood filled blisters over both lower extremities Lt > Rt Neurologic: No gross focal neurologic deficit.  Generalized weakness Psychiatric: Normal mood and affect.   Labs on Admission: I have personally reviewed following labs and imaging studies  CBC: Recent Labs  Lab 11/08/20 1309  WBC 6.8  NEUTROABS 5.2  HGB 10.9*  HCT 33.3*  MCV 97.7  PLT 025   Basic Metabolic Panel: Recent Labs  Lab 11/08/20 1309  NA 138  K 4.2  CL 102  CO2 25  GLUCOSE 108*  BUN 32*  CREATININE 0.65  CALCIUM 8.8*   GFR: Estimated Creatinine Clearance: 49.1 mL/min (by C-G formula based on SCr of 0.65 mg/dL). Liver Function Tests: Recent Labs  Lab 11/08/20 1309  AST 18  ALT 9  ALKPHOS 68  BILITOT 0.7  PROT 7.1  ALBUMIN 3.0*   No results for input(s): LIPASE, AMYLASE in the last 168 hours. No results for input(s): AMMONIA in the last 168 hours. Coagulation Profile: Recent Labs  Lab 11/08/20 1309  INR 0.9   Cardiac Enzymes: No results for input(s): CKTOTAL, CKMB, CKMBINDEX, TROPONINI in the last 168 hours. BNP (last 3 results) No results for input(s): PROBNP in the last 8760 hours. HbA1C: No results for input(s): HGBA1C in the last 72 hours. CBG: No results for input(s): GLUCAP in the last 168 hours. Lipid Profile: No results for input(s): CHOL, HDL, LDLCALC, TRIG, CHOLHDL, LDLDIRECT in the last 72 hours. Thyroid Function Tests: No results for input(s): TSH, T4TOTAL, FREET4, T3FREE, THYROIDAB in the last 72 hours. Anemia Panel: No results for input(s): VITAMINB12, FOLATE, FERRITIN, TIBC, IRON, RETICCTPCT in the last 72 hours. Urine analysis:    Component Value Date/Time   COLORURINE YELLOW (A) 10/25/2020 2148   APPEARANCEUR CLEAR (A) 10/25/2020 2148   LABSPEC 1.010 10/25/2020  2148   PHURINE 7.0 10/25/2020 2148   GLUCOSEU NEGATIVE 10/25/2020 2148   HGBUR NEGATIVE 10/25/2020 2148   BILIRUBINUR NEGATIVE 10/25/2020 2148   KETONESUR 5 (A) 10/25/2020 2148   PROTEINUR NEGATIVE 10/25/2020 2148   NITRITE NEGATIVE 10/25/2020 2148   LEUKOCYTESUR NEGATIVE 10/25/2020 2148    Radiological Exams on Admission: No results found.  EKG: Independently reviewed.   Assessment/Plan Principal Problem:   Cellulitis of lower extremity Active Problems:   COPD (chronic obstructive pulmonary disease) (HCC)   Chronic respiratory failure (HCC)   Essential hypertension    Bilateral lower extremity bullous lesions Patient presents for evaluation of painful blood-filled blisters involving both lower extremities with differential warmth and redness ??  Bilateral lower extremity cellulitis  ??  Localized bullous pemphigoid We will place patient empirically on antibiotic therapy with vancomycin and meropenem  Follow-up results of blood cultures  Pain control with hydrocodone    COPD with chronic respiratory failure Stable and not acutely exacerbated Continue as needed bronchodilator therapy Continue inhaled steroids and Spiriva Continue oxygen supplementation at 2 L to maintain pulse oximetry greater than 92%     Hypertension Continue amlodipine    DVT prophylaxis: Lovenox Code Status: Full code Family Communication: Greater than 50% of time was spent discussing patient's condition and plan of care with her and her daughter-in-law at the bedside.  All questions and concerns have been addressed.  They verbalized understanding and agree with the plan.  CODE STATUS was discussed and she is a full code Disposition Plan: Back to previous home environment Consults called:    Collier Bullock MD [facility]     11/08/2020, 8:06 PM

## 2020-11-08 NOTE — ED Notes (Signed)
Oxygen tank checked- o2 tank full at this time. Pt on 3L [place].

## 2020-11-09 DIAGNOSIS — L03119 Cellulitis of unspecified part of limb: Secondary | ICD-10-CM

## 2020-11-09 DIAGNOSIS — L03115 Cellulitis of right lower limb: Secondary | ICD-10-CM | POA: Diagnosis not present

## 2020-11-09 LAB — BASIC METABOLIC PANEL
Anion gap: 7 (ref 5–15)
BUN: 25 mg/dL — ABNORMAL HIGH (ref 8–23)
CO2: 25 mmol/L (ref 22–32)
Calcium: 8.5 mg/dL — ABNORMAL LOW (ref 8.9–10.3)
Chloride: 104 mmol/L (ref 98–111)
Creatinine, Ser: 0.47 mg/dL (ref 0.44–1.00)
GFR, Estimated: >60 mL/min (ref >60)
Glucose, Bld: 96 mg/dL (ref 70–99)
Potassium: 4 mmol/L (ref 3.5–5.1)
Sodium: 136 mmol/L (ref 135–145)

## 2020-11-09 LAB — CBC
HCT: 31.3 % — ABNORMAL LOW (ref 36.0–46.0)
Hemoglobin: 10.1 g/dL — ABNORMAL LOW (ref 12.0–15.0)
MCH: 31.7 pg (ref 26.0–34.0)
MCHC: 32.3 g/dL (ref 30.0–36.0)
MCV: 98.1 fL (ref 80.0–100.0)
Platelets: 290 10*3/uL (ref 150–400)
RBC: 3.19 MIL/uL — ABNORMAL LOW (ref 3.87–5.11)
RDW: 13.5 % (ref 11.5–15.5)
WBC: 5.7 10*3/uL (ref 4.0–10.5)
nRBC: 0 % (ref 0.0–0.2)

## 2020-11-09 MED ORDER — CLINDAMYCIN HCL 150 MG PO CAPS
300.0000 mg | ORAL_CAPSULE | Freq: Four times a day (QID) | ORAL | Status: DC
  Administered 2020-11-09 – 2020-11-10 (×4): 300 mg via ORAL
  Filled 2020-11-09 (×7): qty 2

## 2020-11-09 MED ORDER — SULFAMETHOXAZOLE-TRIMETHOPRIM 800-160 MG PO TABS: 1.0000 | ORAL_TABLET | Freq: Two times a day (BID) | ORAL | Status: DC

## 2020-11-09 MED ORDER — PREDNISONE 50 MG PO TABS
50.0000 mg | ORAL_TABLET | Freq: Every day | ORAL | Status: DC
  Administered 2020-11-09 – 2020-11-10 (×2): 50 mg via ORAL
  Filled 2020-11-09 (×2): qty 1

## 2020-11-09 NOTE — Progress Notes (Signed)
Cook visited pt. while rounding; pt. lying in bed, complaining of pain in her legs and requesting pain meds.  Pt. says she has lived at a nursing home for the last two years and has been getting good care there.  [place] relayed request for pain meds to RN and reminded Pt. of location of call bell in case further needs arise.  [place] remains available.

## 2020-11-09 NOTE — Progress Notes (Signed)
PROGRESS NOTE    Tara Middleton  OMV:[id number] DOB: [birth year] DOA: 11/08/2020 PCP: Patient, No Pcp Per   Chief Complain: Bilateral lower extremity pain  Brief Narrative: Patient is 86 year old female with history of COPD on home oxygen, breast cancer status post left mastectomy who presented for the evaluation of painful bullous lesions on bilateral legs. She noticed that her blisters were bloody and painful and tender to touch. She was having poor oral intake at home and lost over 20 pounds in last couple of months. On presentation she was hemodynamically stable. Labs were reassuring. She lives in a nursing facilty.  Assessment & Plan:   Principal Problem:   Cellulitis of lower extremity Active Problems:   COPD (chronic obstructive pulmonary disease) (HCC)   Chronic respiratory failure (HCC)   Essential hypertension   Bilateral lower extremity bullous lesions: Lesions are bloody, tender. 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 Started on antibiotics,now changed to bactrim. Follow-up blood cultures.  I doubt this is infectious rash, but will continue antibiotic to prevent superimposed infection.  Will start on steroids. She needs to follow-up with dermatology as an outpatient. Continue supportive care, pain management. Patient says her rash have improved today and they are less painful  COPD/chronic respiratory failure: Stable. Not in exacerbation. Continue as needed bronchodilators. Continue inhaled steroid, Spiriva. Continue supplemental oxygen. She is on oxygen at 2 L/min at home.  Hypertension: Continue amlodipine. Monitor blood pressure  Debility/deconditioning: We will request a PT/OT evaluation.She is a nursing facility resident  History of corneal transplant: Continue home eye drops  Pressure Injury 11/09/20 Buttocks Right;Left;Medial Stage 1 -  Intact skin with non-blanchable redness of a localized area usually over a bony prominence. (Active)  11/09/20 0323  Location: Buttocks   Location Orientation: Right;Left;Medial  Staging: Stage 1 -  Intact skin with non-blanchable redness of a localized area usually over a bony prominence.  Wound Description (Comments):   Present on Admission: Yes   Continue supportive care We will consult wound care.           DVT prophylaxis: Lovenox Code Status: Full Family Communication: called and discussed with Son on phone on 11/09/20  Status is: Inpatient  Remains inpatient appropriate because:Inpatient level of care appropriate due to severity of illness   Dispo: The patient is from: nursing facilty              Anticipated d/c is to: nursing facilty              Anticipated d/c date is: 1 day              Patient currently is not medically stable to d/c.     Consultants: None  Procedures:None  Antimicrobials:  Anti-infectives (From admission, onward)   Start     Dose/Rate Route Frequency Ordered Stop   11/09/20 2100  vancomycin (VANCOREADY) IVPB 1500 mg/300 mL        1,500 mg 150 mL/hr over 120 Minutes Intravenous Every 24 hours 11/08/20 2131     11/09/20 1000  meropenem (MERREM) 1 g in sodium chloride 0.9 % 100 mL IVPB        1 g 200 mL/hr over 30 Minutes Intravenous Every 12 hours 11/08/20 2131     11/08/20 2015  vancomycin (VANCOCIN) IVPB 1000 mg/200 mL premix  Status:  Discontinued        1,000 mg 200 mL/hr over 60 Minutes Intravenous  Once 11/08/20 2000 11/08/20 2016   11/08/20 2015  meropenem (MERREM) 1 g in sodium  chloride 0.9 % 100 mL IVPB        1 g 200 mL/hr over 30 Minutes Intravenous  Once 11/08/20 2000 11/09/20 0324   11/08/20 1830  vancomycin (VANCOREADY) IVPB 1750 mg/350 mL        1,750 mg 175 mL/hr over 120 Minutes Intravenous  Once 11/08/20 1826 11/08/20 2214      Subjective: Patient seen and examined at the bedside this morning.  Hemodynamically stable.  Eating her breakfast.  Comfortable.  She says her pain on the blisters on the legs is better today.  Denies any other  complaints.  Objective: Vitals:   11/08/20 2230 11/08/20 2315 11/09/20 0459 11/09/20 0726  BP: (!) 116/44 (!) 156/52 (!) 156/52   Pulse: (!) 49 (!) 52 (!) 54   Resp: 12 18 20    Temp:  97.9 F (36.6 C) 97.6 F (36.4 C)   TempSrc:      SpO2: 100% 100% 99% 99%  Weight:      Height:  5\' 3"  (1.6 m)      Intake/Output Summary (Last 24 hours) at 11/09/2020 0739 Last data filed at 11/09/2020 0500 Gross per 24 hour  Intake --  Output 150 ml  Net -150 ml   Filed Weights   11/08/20 1305  Weight: 84.4 kg    Examination:  General exam: Deconditioned elderly female Respiratory system: Bilateral equal air entry, normal vesicular breath sounds, no wheezes or crackles  Cardiovascular system: S1 & S2 heard, RRR. No JVD, murmurs, rubs, gallops or clicks. No pedal edema. Gastrointestinal system: Abdomen is nondistended, soft and nontender. No organomegaly or masses felt. Normal bowel sounds heard. Central nervous system: Alert and oriented. No focal neurological deficits. Extremities: No edema, no clubbing ,no cyanosis Skin: Pressure 1 sacral ulcer,other as below      Data Reviewed: I have personally reviewed following labs and imaging studies  CBC: Recent Labs  Lab 11/08/20 1309 11/09/20 0448  WBC 6.8 5.7  NEUTROABS 5.2  --   HGB 10.9* 10.1*  HCT 33.3* 31.3*  MCV 97.7 98.1  PLT 322 591   Basic Metabolic Panel: Recent Labs  Lab 11/08/20 1309 11/09/20 0448  NA 138 136  K 4.2 4.0  CL 102 104  CO2 25 25  GLUCOSE 108* 96  BUN 32* 25*  CREATININE 0.65 0.47  CALCIUM 8.8* 8.5*   GFR: Estimated Creatinine Clearance: 49.1 mL/min (by C-G formula based on SCr of 0.47 mg/dL). Liver Function Tests: Recent Labs  Lab 11/08/20 1309  AST 18  ALT 9  ALKPHOS 68  BILITOT 0.7  PROT 7.1  ALBUMIN 3.0*   No results for input(s): LIPASE, AMYLASE in the last 168 hours. No results for input(s): AMMONIA in the last 168 hours. Coagulation Profile: Recent Labs  Lab  11/08/20 1309  INR 0.9   Cardiac Enzymes: No results for input(s): CKTOTAL, CKMB, CKMBINDEX, TROPONINI in the last 168 hours. BNP (last 3 results) No results for input(s): PROBNP in the last 8760 hours. HbA1C: No results for input(s): HGBA1C in the last 72 hours. CBG: No results for input(s): GLUCAP in the last 168 hours. Lipid Profile: No results for input(s): CHOL, HDL, LDLCALC, TRIG, CHOLHDL, LDLDIRECT in the last 72 hours. Thyroid Function Tests: No results for input(s): TSH, T4TOTAL, FREET4, T3FREE, THYROIDAB in the last 72 hours. Anemia Panel: No results for input(s): VITAMINB12, FOLATE, FERRITIN, TIBC, IRON, RETICCTPCT in the last 72 hours. Sepsis Labs: No results for input(s): PROCALCITON, LATICACIDVEN in the last 168 hours.  Recent Results (from the past 240 hour(s))  Culture, blood (Routine X 2) w Reflex to ID Panel     Status: None (Preliminary result)   Collection Time: 11/08/20  6:07 PM   Specimen: Left Antecubital; Blood  Result Value Ref Range Status   Specimen Description LEFT ANTECUBITAL  Final   Special Requests   Final    BOTTLES DRAWN AEROBIC AND ANAEROBIC Blood Culture results may not be optimal due to an inadequate volume of blood received in culture bottles   Culture   Final    NO GROWTH < 12 HOURS Performed at [facility], [address] [place], [place] [address]    Report Status PENDING  Incomplete  Culture, blood (Routine X 2) w Reflex to ID Panel     Status: None (Preliminary result)   Collection Time: 11/08/20  6:12 PM   Specimen: Right Antecubital; Blood  Result Value Ref Range Status   Specimen Description RIGHT ANTECUBITAL  Final   Special Requests   Final    BOTTLES DRAWN AEROBIC AND ANAEROBIC Blood Culture adequate volume   Culture   Final    NO GROWTH < 12 HOURS Performed at [facility], [address], [place], [place] [address]    Report Status PENDING  Incomplete  Resp Panel by RT-PCR (Flu A&B, Covid)  Nasopharyngeal Swab     Status: None   Collection Time: 11/08/20  6:48 PM   Specimen: Nasopharyngeal Swab; Nasopharyngeal(NP) swabs in vial transport medium  Result Value Ref Range Status   SARS Coronavirus 2 by RT PCR NEGATIVE NEGATIVE Final    Comment: (NOTE) SARS-CoV-2 target nucleic acids are NOT DETECTED.  The SARS-CoV-2 RNA is generally detectable in upper respiratory specimens during the acute phase of infection. The lowest concentration of SARS-CoV-2 viral copies this assay can detect is 138 copies/mL. A negative result does not preclude SARS-Cov-2 infection and should not be used as the sole basis for treatment or other patient management decisions. A negative result may occur with  improper specimen collection/handling, submission of specimen other than nasopharyngeal swab, presence of viral mutation(s) within the areas targeted by this assay, and inadequate number of viral copies(<138 copies/mL). A negative result must be combined with clinical observations, patient history, and epidemiological information. The expected result is Negative.  Fact Sheet for Patients:  EntrepreneurPulse.com.au  Fact Sheet for Healthcare Providers:  IncredibleEmployment.be  This test is no t yet approved or cleared by the [place] FDA and  has been authorized for detection and/or diagnosis of SARS-CoV-2 by FDA under an Emergency Use Authorization (EUA). This EUA will remain  in effect (meaning this test can be used) for the duration of the COVID-19 declaration under Section 564(b)(1) of the Act, 21 U.S.C.section 360bbb-3(b)(1), unless the authorization is terminated  or revoked sooner.       Influenza A by PCR NEGATIVE NEGATIVE Final   Influenza B by PCR NEGATIVE NEGATIVE Final    Comment: (NOTE) The Xpert Xpress SARS-CoV-2/FLU/RSV plus assay is intended as an aid in the diagnosis of influenza from Nasopharyngeal swab specimens and should not be  used as a sole basis for treatment. Nasal washings and aspirates are unacceptable for Xpert Xpress SARS-CoV-2/FLU/RSV testing.  Fact Sheet for Patients: EntrepreneurPulse.com.au  Fact Sheet for Healthcare Providers: IncredibleEmployment.be  This test is not yet approved or cleared by the [place] FDA and has been authorized for detection and/or diagnosis of SARS-CoV-2 by FDA under an Emergency Use Authorization (EUA). This EUA will  remain in effect (meaning this test can be used) for the duration of the COVID-19 declaration under Section 564(b)(1) of the Act, 21 U.S.C. section 360bbb-3(b)(1), unless the authorization is terminated or revoked.  Performed at [facility], [address] [place], [place] [address]          Radiology Studies: No results found.      Scheduled Meds: . acidophilus  1 capsule Oral Daily  . amLODipine  5 mg Oral Daily  . budesonide (PULMICORT) nebulizer solution  0.5 mg Nebulization BID  . cholecalciferol  1,000 Units Oral Daily  . dorzolamide  1 drop Right Eye BID   And  . timolol  1 drop Right Eye BID  . latanoprost  1 drop Right Eye QHS  . LiquaCel  30 mL Oral BID  . loteprednol  1 drop Right Eye Daily  . magnesium oxide  400 mg Oral Daily  . pantoprazole  40 mg Oral Daily  . polyethylene glycol  17 g Oral Daily  . sodium chloride flush  3 mL Intravenous Q12H  . umeclidinium bromide  1 puff Inhalation Daily  . ursodiol  300 mg Oral BID   Continuous Infusions: . sodium chloride    . meropenem (MERREM) IV    . vancomycin       LOS: 1 day    Time spent: More than 50% of that time was spent in counseling and/or coordination of care.      Shelly Coss, MD [facility] P12/18/2021, 7:39 AM

## 2020-11-09 NOTE — Evaluation (Signed)
Physical Therapy Evaluation Patient Details Name: Tara Middleton MRN: [id number] DOB: [birth year] Today's Date: 11/09/2020   History of Present Illness  Patient is 86 year old female with history of COPD on home oxygen, breast cancer status post left mastectomy who presented for the evaluation of painful bullous lesions on bilateral legs.  Medical history includes: COPD, chronic respiratory failure, HTN on 2L of continuous oxygen at home.  Clinical Impression  Patient received while seated on the toilet with OT.  Pt agreeable to PT interventions today requiring minA to complete sit<>stand transfers on toilet x2.  Completed ambulation 2x85ft minA using 2L continuous O2 and RW.  Patient requiring multiple standing rest breaks for pursed lip breathing, SpO2 95% throughout interventions today.  Pt given verbal cues for pursed lip breathing throughout ambulation.  Pt required extra time to complete sit<>stand, sit<>supine transfers due to BIL LE pain and tenderness.  Overall patient able to participate well with good tolerance to interventions today.     Follow Up Recommendations SNF    Equipment Recommendations       Recommendations for Other Services       Precautions / Restrictions Precautions Precautions: Fall Restrictions Weight Bearing Restrictions: No      Mobility  Bed Mobility Overal bed mobility: Modified Independent             General bed mobility comments: Increased time + HoB elevated    Transfers Overall transfer level: Needs assistance Equipment used: Rolling walker (2 wheeled) Transfers: Sit to/from Stand Sit to Stand: Min guard         General transfer comment: Pt requiring verbal cuing for pursed lip breathing and to stand with a more upright posture  Ambulation/Gait Ambulation/Gait assistance: Min guard Gait Distance (Feet): 2x75 Feet Assistive device: Rolling walker (2 wheeled)   Gait velocity: Decreased   General Gait Details: Pt with decreased  gait velocity, requiring multiple standing rest breaks for pursed lip breathing  Stairs            Wheelchair Mobility    Modified Rankin (Stroke Patients Only)       Balance Overall balance assessment: Needs assistance Sitting-balance support: No upper extremity supported;Feet supported Sitting balance-Leahy Scale: Good Sitting balance - Comments: Pt able to maintain seated stability EOB with feet on floor and SBA   Standing balance support: Bilateral upper extremity supported Standing balance-Leahy Scale: Fair Standing balance comment: Patient with decreased postural awareness requires CGA for standing balance for safety and fall prevention                             Pertinent Vitals/Pain Pain Assessment: Faces Faces Pain Scale: Hurts a little bit Pain Location: BIL LE Pain Descriptors / Indicators: Aching Pain Intervention(s): Monitored during session;Repositioned    Home Living Family/patient expects to be discharged to:: Skilled nursing facility                 Additional Comments: Resident at [facility] facility (Pt reports as [facility], prior name)    Prior Function Level of Independence: Independent with assistive device(s)         Comments: Reports assist for laundry/meals     Hand Dominance   Dominant Hand: Right    Extremity/Trunk Assessment   Upper Extremity Assessment Upper Extremity Assessment: Generalized weakness    Lower Extremity Assessment Lower Extremity Assessment: Generalized weakness    Cervical / Trunk Assessment Cervical / Trunk Assessment: Kyphotic  Communication  Communication: HOH  Cognition Arousal/Alertness: Awake/alert Behavior During Therapy: WFL for tasks assessed/performed Overall Cognitive Status: Within Functional Limits for tasks assessed                                 General Comments: Pt able to follow multi step instructions      General Comments General comments  (skin integrity, edema, etc.): SpO2 95% throughout interventions today on 2L continuous via nasal cannula        Assessment/Plan    PT Assessment Patient needs continued PT services  PT Problem List Decreased strength;Decreased mobility;Decreased safety awareness;Decreased range of motion;Decreased coordination;Decreased activity tolerance;Decreased balance       PT Treatment Interventions Gait training;Therapeutic exercise;DME instruction;Balance training;Functional mobility training;Therapeutic activities;Patient/family education    PT Goals (Current goals can be found in the Care Plan section)  Acute Rehab PT Goals Patient Stated Goal: To be able to breath and walk PT Goal Formulation: With patient Time For Goal Achievement: 11/23/20 Potential to Achieve Goals: Fair    Frequency Min 2X/week   Barriers to discharge        Co-evaluation               AM-PAC PT "6 Clicks" Mobility  Outcome Measure Help needed turning from your back to your side while in a flat bed without using bedrails?: A Little Help needed moving from lying on your back to sitting on the side of a flat bed without using bedrails?: A Little Help needed moving to and from a bed to a chair (including a wheelchair)?: A Little Help needed standing up from a chair using your arms (e.g., wheelchair or bedside chair)?: A Little Help needed to walk in hospital room?: A Little Help needed climbing 3-5 steps with a railing? : A Lot 6 Click Score: 17    End of Session Equipment Utilized During Treatment: Gait belt Activity Tolerance: Patient tolerated treatment well Patient left: in bed;with call bell/phone within reach;with bed alarm set Nurse Communication:  (Message sent to nurse via secure chat about pt's response to interventions today and needing placement of a purewick) PT Visit Diagnosis: Unsteadiness on feet (R26.81);Other abnormalities of gait and mobility (R26.89);History of falling (Z91.81)     Time: 8588-5027 PT Time Calculation (min) (ACUTE ONLY): 34 min   Charges:   PT Evaluation $PT Eval Low Complexity: 1 Low PT Treatments $Gait Training: 8-22 mins $Therapeutic Activity: 8-22 mins        Duanne Guess, PT, DPT 11/09/20, 4:30 PM   Tara Middleton 11/09/2020, 4:30 PM

## 2020-11-09 NOTE — Evaluation (Signed)
Occupational Therapy Evaluation Patient Details Name: Tara Middleton MRN: [id number] DOB: 06-09-1929 Today's Date: 11/09/2020    History of Present Illness Patient is 86 year old female with history of COPD on home oxygen, breast cancer status post left mastectomy who presented for the evaluation of painful bullous lesions on bilateral legs   Clinical Impression   Ms Wallick was seen for OT evaluation this date. Prior to hospital admission, pt was resident at [facility] nursing home and was receiving PT/OT services. Pt reports MOD I mobility/ADLs using RW, assist for meals/laundry. Pt presents to acute OT demonstrating impaired ADL performance and functional mobility 2/2 decreased activity tolerance, functional strength/balance deficits, and poor insight into deficits. Pt currently requires SETUP don B socks at bed level. CGA + RW for toilet t/f and SpO2 92% and stable t/o on 2L [place]. Pt would benefit from skilled OT to address noted impairments and functional limitations (see below for any additional details) in order to maximize safety and independence while minimizing falls risk and caregiver burden. Upon hospital discharge, recommend HHOT to maximize pt safety and return to functional independence during meaningful occupations of daily life.      Follow Up Recommendations  Home health OT    Equipment Recommendations  None recommended by OT    Recommendations for Other Services       Precautions / Restrictions Precautions Precautions: Fall Restrictions Weight Bearing Restrictions: No      Mobility Bed Mobility Overal bed mobility: Modified Independent             General bed mobility comments: Increased time + HoB elevated    Transfers Overall transfer level: Needs assistance Equipment used: Rolling walker (2 wheeled) Transfers: Sit to/from Stand Sit to Stand: Min guard              Balance Overall balance assessment: Needs  assistance Sitting-balance support: No upper extremity supported;Feet supported Sitting balance-Leahy Scale: Good     Standing balance support: Bilateral upper extremity supported Standing balance-Leahy Scale: Fair                             ADL either performed or assessed with clinical judgement   ADL Overall ADL's : Needs assistance/impaired                                       General ADL Comments: SETUP don B socks at bed level. CGA + RW for toilet t/f                  Pertinent Vitals/Pain Pain Assessment: No/denies pain     Hand Dominance Right   Extremity/Trunk Assessment Upper Extremity Assessment Upper Extremity Assessment: Generalized weakness   Lower Extremity Assessment Lower Extremity Assessment: Generalized weakness       Communication Communication Communication: HOH   Cognition Arousal/Alertness: Awake/alert Behavior During Therapy: WFL for tasks assessed/performed Overall Cognitive Status: Within Functional Limits for tasks assessed                                     General Comments  SpO2 92% and stable t/o on 2L [place]    Exercises Exercises: Other exercises Other Exercises Other Exercises: Pt educated re: OT role, DME recs, d/c recs, falls prevention, ECS Other Exercises: LBD,  toileting, sup>sit, sti<>stand, sitting/standing balance/tolerance   Shoulder Instructions      Home Living Family/patient expects to be discharged to:: Skilled nursing facility                                 Additional Comments: Resident at [facility] [facility] (Pt reports as [facility], prior name)      Prior Functioning/Environment Level of Independence: Independent with assistive device(s)        Comments: Reports assist for laundry/meals        OT Problem List: Decreased activity tolerance;Impaired balance (sitting and/or standing);Decreased safety awareness      OT  Treatment/Interventions: Self-care/ADL training;Therapeutic exercise;Energy conservation;DME and/or AE instruction;Therapeutic activities;Patient/family education;Balance training    OT Goals(Current goals can be found in the care plan section) Acute Rehab OT Goals Patient Stated Goal: To breathe better OT Goal Formulation: With patient Time For Goal Achievement: 11/23/20 Potential to Achieve Goals: Good  OT Frequency: Min 1X/week    AM-PAC OT "6 Clicks" Daily Activity     Outcome Measure Help from another person eating meals?: None Help from another person taking care of personal grooming?: A Little Help from another person toileting, which includes using toliet, bedpan, or urinal?: A Little Help from another person bathing (including washing, rinsing, drying)?: A Little Help from another person to put on and taking off regular upper body clothing?: None Help from another person to put on and taking off regular lower body clothing?: A Little 6 Click Score: 20   End of Session Equipment Utilized During Treatment: Rolling walker;Oxygen  Activity Tolerance: Patient tolerated treatment well Patient left: Other (comment) (seated on commode c PT in room)  OT Visit Diagnosis: Other abnormalities of gait and mobility (R26.89)                Time: 1450-1507 OT Time Calculation (min): 17 min Charges:  OT General Charges $OT Visit: 1 Visit OT Evaluation $OT Eval Low Complexity: 1 Low OT Treatments $Self Care/Home Management : 8-22 mins  Dessie Coma, M.S. OTR/L  11/09/20, 3:58 PM  ascom [phone]

## 2020-11-10 DIAGNOSIS — L03119 Cellulitis of unspecified part of limb: Secondary | ICD-10-CM | POA: Diagnosis not present

## 2020-11-10 DIAGNOSIS — L8990 Pressure ulcer of unspecified site, unspecified stage: Secondary | ICD-10-CM | POA: Insufficient documentation

## 2020-11-10 DIAGNOSIS — L03115 Cellulitis of right lower limb: Secondary | ICD-10-CM | POA: Diagnosis not present

## 2020-11-10 LAB — RESP PANEL BY RT-PCR (FLU A&B, COVID) ARPGX2
Influenza A by PCR: NEGATIVE
Influenza B by PCR: NEGATIVE
SARS Coronavirus 2 by RT PCR: NEGATIVE

## 2020-11-10 MED ORDER — CLINDAMYCIN HCL 300 MG PO CAPS: 300.0000 mg | ORAL_CAPSULE | Freq: Four times a day (QID) | ORAL | 0 refills | Status: AC

## 2020-11-10 MED ORDER — PREDNISONE 20 MG PO TABS: 40.0000 mg | ORAL_TABLET | Freq: Every day | ORAL | 0 refills | Status: AC

## 2020-11-10 MED ORDER — HYDROCODONE-ACETAMINOPHEN 5-325 MG PO TABS: 1.0000 | ORAL_TABLET | Freq: Four times a day (QID) | ORAL | 0 refills | Status: AC | PRN

## 2020-11-10 NOTE — Progress Notes (Deleted)
PROGRESS NOTE    Tara Middleton  HQI:[id number] DOB: 1929-04-27 DOA: 11/08/2020 PCP: Patient, No Pcp Per   Chief Complain: Bilateral lower extremity pain  Brief Narrative: Patient is 86 year old female with history of COPD on home oxygen, breast cancer status post left mastectomy who presented for the evaluation of painful bullous lesions on bilateral legs. She noticed that her blisters were bloody and painful and tender to touch. She was having poor oral intake at home and lost over 20 pounds in last couple of months. On presentation she was hemodynamically stable. Labs were reassuring. She lives in a nursing facilty.  Assessment & Plan:   Principal Problem:   Cellulitis of lower extremity Active Problems:   COPD (chronic obstructive pulmonary disease) (HCC)   Chronic respiratory failure (HCC)   Essential hypertension   Bilateral lower extremity bullous lesions: Lesions are bloody, tender. 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 Started on antibiotics,now changed to bactrim. Follow-up blood cultures.  I doubt this is infectious rash, but will continue antibiotic to prevent superimposed infection.  Will start on steroids. She needs to follow-up with dermatology as an outpatient. Continue supportive care, pain management. Patient says her rash have improved today and they are less painful  COPD/chronic respiratory failure: Stable. Not in exacerbation. Continue as needed bronchodilators. Continue inhaled steroid, Spiriva. Continue supplemental oxygen. She is on oxygen at 2 L/min at home.  Hypertension: Continue amlodipine. Monitor blood pressure  Debility/deconditioning: We will request a PT/OT evaluation.She is a nursing facility resident  History of corneal transplant: Continue home eye drops  Pressure Injury 11/09/20 Buttocks Right;Left;Medial Stage 1 -  Intact skin with non-blanchable redness of a localized area usually over a bony prominence. (Active)  11/09/20 0323  Location: Buttocks   Location Orientation: Right;Left;Medial  Staging: Stage 1 -  Intact skin with non-blanchable redness of a localized area usually over a bony prominence.  Wound Description (Comments):   Present on Admission: Yes   Continue supportive care We will consult wound care.           DVT prophylaxis: Lovenox Code Status: Full Family Communication: called and discussed with Son on phone on 11/09/20  Status is: Inpatient  Remains inpatient appropriate because:Inpatient level of care appropriate due to severity of illness   Dispo: The patient is from: nursing facilty              Anticipated d/c is to: nursing facilty              Anticipated d/c date is: 1 day              Patient currently is not medically stable to d/c.     Consultants: None  Procedures:None  Antimicrobials:  Anti-infectives (From admission, onward)   Start     Dose/Rate Route Frequency Ordered Stop   11/09/20 2100  vancomycin (VANCOREADY) IVPB 1500 mg/300 mL  Status:  Discontinued        1,500 mg 150 mL/hr over 120 Minutes Intravenous Every 24 hours 11/08/20 2131 11/09/20 1017   11/09/20 2000  clindamycin (CLEOCIN) capsule 300 mg        300 mg Oral Every 6 hours 11/09/20 1306 11/14/20 1759   11/09/20 1115  sulfamethoxazole-trimethoprim (BACTRIM DS) 800-160 MG per tablet 1 tablet  Status:  Discontinued        1 tablet Oral Every 12 hours 11/09/20 1017 11/09/20 1304   11/09/20 1000  meropenem (MERREM) 1 g in sodium chloride 0.9 % 100 mL IVPB  Status:  Discontinued  1 g 200 mL/hr over 30 Minutes Intravenous Every 12 hours 11/08/20 2131 11/09/20 1017   11/08/20 2015  vancomycin (VANCOCIN) IVPB 1000 mg/200 mL premix  Status:  Discontinued        1,000 mg 200 mL/hr over 60 Minutes Intravenous  Once 11/08/20 2000 11/08/20 2016   11/08/20 2015  meropenem (MERREM) 1 g in sodium chloride 0.9 % 100 mL IVPB        1 g 200 mL/hr over 30 Minutes Intravenous  Once 11/08/20 2000 11/09/20 1128   11/08/20 1830   vancomycin (VANCOREADY) IVPB 1750 mg/350 mL        1,750 mg 175 mL/hr over 120 Minutes Intravenous  Once 11/08/20 1826 11/09/20 0827      Subjective: Patient seen and examined at the bedside this morning.  Hemodynamically stable.  Eating her breakfast.  Comfortable.  She says her pain on the blisters on the legs is better today.  Denies any other complaints.  Objective: Vitals:   11/09/20 2113 11/09/20 2358 11/10/20 0514 11/10/20 0723  BP: 121/65 (!) 155/61 137/64   Pulse: 61 (!) 52 (!) 54   Resp: 16 16 16    Temp: 97.6 F (36.4 C) 97.7 F (36.5 C) 97.6 F (36.4 C)   TempSrc:      SpO2: 98% 100% 100% 99%  Weight:      Height:        Intake/Output Summary (Last 24 hours) at 11/10/2020 0739 Last data filed at 11/10/2020 0500 Gross per 24 hour  Intake 160 ml  Output 250 ml  Net -90 ml   Filed Weights   11/08/20 1305  Weight: 84.4 kg    Examination:  General exam: Deconditioned elderly female Respiratory system: Bilateral equal air entry, normal vesicular breath sounds, no wheezes or crackles  Cardiovascular system: S1 & S2 heard, RRR. No JVD, murmurs, rubs, gallops or clicks. No pedal edema. Gastrointestinal system: Abdomen is nondistended, soft and nontender. No organomegaly or masses felt. Normal bowel sounds heard. Central nervous system: Alert and oriented. No focal neurological deficits. Extremities: No edema, no clubbing ,no cyanosis Skin: Pressure 1 sacral ulcer,other as below      Data Reviewed: I have personally reviewed following labs and imaging studies  CBC: Recent Labs  Lab 11/08/20 1309 11/09/20 0448  WBC 6.8 5.7  NEUTROABS 5.2  --   HGB 10.9* 10.1*  HCT 33.3* 31.3*  MCV 97.7 98.1  PLT 322 469   Basic Metabolic Panel: Recent Labs  Lab 11/08/20 1309 11/09/20 0448  NA 138 136  K 4.2 4.0  CL 102 104  CO2 25 25  GLUCOSE 108* 96  BUN 32* 25*  CREATININE 0.65 0.47  CALCIUM 8.8* 8.5*   GFR: Estimated Creatinine Clearance: 49.1 mL/min  (by C-G formula based on SCr of 0.47 mg/dL). Liver Function Tests: Recent Labs  Lab 11/08/20 1309  AST 18  ALT 9  ALKPHOS 68  BILITOT 0.7  PROT 7.1  ALBUMIN 3.0*   No results for input(s): LIPASE, AMYLASE in the last 168 hours. No results for input(s): AMMONIA in the last 168 hours. Coagulation Profile: Recent Labs  Lab 11/08/20 1309  INR 0.9   Cardiac Enzymes: No results for input(s): CKTOTAL, CKMB, CKMBINDEX, TROPONINI in the last 168 hours. BNP (last 3 results) No results for input(s): PROBNP in the last 8760 hours. HbA1C: No results for input(s): HGBA1C in the last 72 hours. CBG: No results for input(s): GLUCAP in the last 168 hours. Lipid Profile: No results  for input(s): CHOL, HDL, LDLCALC, TRIG, CHOLHDL, LDLDIRECT in the last 72 hours. Thyroid Function Tests: No results for input(s): TSH, T4TOTAL, FREET4, T3FREE, THYROIDAB in the last 72 hours. Anemia Panel: No results for input(s): VITAMINB12, FOLATE, FERRITIN, TIBC, IRON, RETICCTPCT in the last 72 hours. Sepsis Labs: No results for input(s): PROCALCITON, LATICACIDVEN in the last 168 hours.  Recent Results (from the past 240 hour(s))  Culture, blood (Routine X 2) w Reflex to ID Panel     Status: None (Preliminary result)   Collection Time: 11/08/20  6:07 PM   Specimen: Left Antecubital; Blood  Result Value Ref Range Status   Specimen Description LEFT ANTECUBITAL  Final   Special Requests   Final    BOTTLES DRAWN AEROBIC AND ANAEROBIC Blood Culture results may not be optimal due to an inadequate volume of blood received in culture bottles   Culture   Final    NO GROWTH 2 DAYS Performed at [facility], [address], [place], [place] [address]    Report Status PENDING  Incomplete  Culture, blood (Routine X 2) w Reflex to ID Panel     Status: None (Preliminary result)   Collection Time: 11/08/20  6:12 PM   Specimen: Right Antecubital; Blood  Result Value Ref Range Status   Specimen Description  RIGHT ANTECUBITAL  Final   Special Requests   Final    BOTTLES DRAWN AEROBIC AND ANAEROBIC Blood Culture adequate volume   Culture   Final    NO GROWTH 2 DAYS Performed at [facility], [address], [place], [place] [address]    Report Status PENDING  Incomplete  Resp Panel by RT-PCR (Flu A&B, Covid) Nasopharyngeal Swab     Status: None   Collection Time: 11/08/20  6:48 PM   Specimen: Nasopharyngeal Swab; Nasopharyngeal(NP) swabs in vial transport medium  Result Value Ref Range Status   SARS Coronavirus 2 by RT PCR NEGATIVE NEGATIVE Final    Comment: (NOTE) SARS-CoV-2 target nucleic acids are NOT DETECTED.  The SARS-CoV-2 RNA is generally detectable in upper respiratory specimens during the acute phase of infection. The lowest concentration of SARS-CoV-2 viral copies this assay can detect is 138 copies/mL. A negative result does not preclude SARS-Cov-2 infection and should not be used as the sole basis for treatment or other patient management decisions. A negative result may occur with  improper specimen collection/handling, submission of specimen other than nasopharyngeal swab, presence of viral mutation(s) within the areas targeted by this assay, and inadequate number of viral copies(<138 copies/mL). A negative result must be combined with clinical observations, patient history, and epidemiological information. The expected result is Negative.  Fact Sheet for Patients:  EntrepreneurPulse.com.au  Fact Sheet for Healthcare Providers:  IncredibleEmployment.be  This test is no t yet approved or cleared by the [place] FDA and  has been authorized for detection and/or diagnosis of SARS-CoV-2 by FDA under an Emergency Use Authorization (EUA). This EUA will remain  in effect (meaning this test can be used) for the duration of the COVID-19 declaration under Section 564(b)(1) of the Act, 21 U.S.C.section 360bbb-3(b)(1), unless the  authorization is terminated  or revoked sooner.       Influenza A by PCR NEGATIVE NEGATIVE Final   Influenza B by PCR NEGATIVE NEGATIVE Final    Comment: (NOTE) The Xpert Xpress SARS-CoV-2/FLU/RSV plus assay is intended as an aid in the diagnosis of influenza from Nasopharyngeal swab specimens and should not be used as a sole basis for treatment.  Nasal washings and aspirates are unacceptable for Xpert Xpress SARS-CoV-2/FLU/RSV testing.  Fact Sheet for Patients: EntrepreneurPulse.com.au  Fact Sheet for Healthcare Providers: IncredibleEmployment.be  This test is not yet approved or cleared by the [place] FDA and has been authorized for detection and/or diagnosis of SARS-CoV-2 by FDA under an Emergency Use Authorization (EUA). This EUA will remain in effect (meaning this test can be used) for the duration of the COVID-19 declaration under Section 564(b)(1) of the Act, 21 U.S.C. section 360bbb-3(b)(1), unless the authorization is terminated or revoked.  Performed at [facility], [address], [place], [place] [address]          Radiology Studies: No results found.      Scheduled Meds: . acidophilus  1 capsule Oral Daily  . amLODipine  5 mg Oral Daily  . budesonide (PULMICORT) nebulizer solution  0.5 mg Nebulization BID  . cholecalciferol  1,000 Units Oral Daily  . clindamycin  300 mg Oral Q6H  . dorzolamide  1 drop Right Eye BID   And  . timolol  1 drop Right Eye BID  . latanoprost  1 drop Right Eye QHS  . loteprednol  1 drop Right Eye Daily  . magnesium oxide  400 mg Oral Daily  . pantoprazole  40 mg Oral Daily  . polyethylene glycol  17 g Oral Daily  . predniSONE  50 mg Oral Q breakfast  . sodium chloride flush  3 mL Intravenous Q12H  . umeclidinium bromide  1 puff Inhalation Daily  . ursodiol  300 mg Oral BID   Continuous Infusions: . sodium chloride       LOS: 2 days    Time spent: More than  50% of that time was spent in counseling and/or coordination of care.      Shelly Coss, MD [facility] P12/19/2021, 7:39 AM

## 2020-11-10 NOTE — Consult Note (Signed)
[facility] Nurse Consult Note: Reason for Consult:Wounds to bilateral LEs Wound type: Superficial blood-filled blisters, few with seeping Pressure Injury POA:N/A Measurement:Per Nursing Flow HSheet Wound bed:LEs with evidence of chronic vasculitic changes, no edema, bruising Drainage (amount, consistency, odor) None. Small serosanguinous on admission Periwound:As described above Dressing procedure/placement/frequency:Orders provided for Nursing for topical care with supportive care using Prevalon pressure redistribution heel boots.  [facility] nursing team will not follow, but will remain available to this patient, the nursing and medical teams.  Please re-consult if needed. Thanks, Maudie Flakes, MSN, RN, [place], [facility]  Pager# [phone]

## 2020-11-10 NOTE — Discharge Summary (Signed)
Physician Discharge Summary  Sheylin Scharnhorst WUJ:[id number] DOB: 03-Aug-1929 DOA: 11/08/2020  PCP: Patient, No Pcp Per  Admit date: 11/08/2020 Discharge date: 11/10/2020  Admitted From: Home Disposition:  Home  Discharge Condition:Stable CODE STATUS:FULL Diet recommendation: Soft diet   Brief/Interim Summary: Patient is 86 year old female with history of COPD on home oxygen, breast cancer status post left mastectomy who presented for the evaluation of painful bullous lesions on bilateral legs. She noticed that her blisters were bloody and painful and tender to touch. She was having poor oral intake at home and lost over 20 pounds in last couple of months. On presentation she was hemodynamically stable. Labs were reassuring. She lives in a nursing facilty.  Her rash has not progressed and the x-ray looks better and are nontender.  Patient feels good as well.  She responded to antibiotics and steroids.  We recommend follow-up with dermatology as an outpatient.  She is medically stable for discharge today with oral antibiotics/steroids.  Following problems were addressed during her hospitalization:  Bilateral lower extremity bullous lesions: Lesions were bloody, tender.  Differential could be various but started on oral steroids and she responded. Started on antibiotics,now changed to bactrim.   I doubt this is infectious rash, but will continue antibiotic to prevent superimposed infection.   She needs to follow-up with dermatology as an outpatient if the bullous lesions persist. Continue supportive care, pain management. Rash have improved  and they are less painful  COPD/chronic respiratory failure: Stable. Not in exacerbation. Continue as needed bronchodilators. Continue inhaled steroid, Spiriva. Continue supplemental oxygen. She is on oxygen at 2 L/min at home.  Hypertension: Continue amlodipine. Monitor blood pressure  Debility/deconditioning: PT/OT evaluation done.She is a nursing  facility resident  History of corneal transplant: Continue home eye drops.  We recommend to follow-up with her ophthalmologist as an outpatient.  Pressure Injury 11/09/20 Buttocks Right;Left;Medial Stage 1 -  Intact skin with non-blanchable redness of a localized area usually over a bony prominence. (Active)  11/09/20 0323  Location: Buttocks  Location Orientation: Right;Left;Medial  Staging: Stage 1 -  Intact skin with non-blanchable redness of a localized area usually over a bony prominence.  Wound Description (Comments):   Present on Admission: Yes   Continue supportive care      Discharge Diagnoses:  Principal Problem:   Cellulitis of lower extremity Active Problems:   COPD (chronic obstructive pulmonary disease) (HCC)   Chronic respiratory failure (HCC)   Essential hypertension    Discharge Instructions  Discharge Instructions    Diet general   Complete by: As directed    Soft diet   Discharge instructions   Complete by: As directed    1)Please take prescribed medications as instructed 2)Follow up with a dermatologist as an outpatient for the evaluation of the leg rash if they persist 3)Follow up with your eye doctor   Increase activity slowly   Complete by: As directed    No wound care   Complete by: As directed      Allergies as of 11/10/2020      Reactions   Amoxicillin-pot Clavulanate    Cefaclor    Cefuroxime Sodium-nacl    Clindamycin/lincomycin    Codeine    Dicloxacillin    Lisinopril    Nabumetone    Promethazine    Remeron [mirtazapine]    Sulfa Antibiotics       Medication List    TAKE these medications   Acidophilus Tabs Take 1 tablet by mouth every 12 (twelve) hours.  albuterol (2.5 MG/3ML) 0.083% nebulizer solution Commonly known as: PROVENTIL Take 3 mLs by nebulization every 4 (four) hours as needed for wheezing or shortness of breath.   albuterol 0.63 MG/3ML nebulizer solution Commonly known as: ACCUNEB Take 3 mLs by  nebulization every 8 (eight) hours as needed for wheezing or shortness of breath.   amLODipine 5 MG tablet Commonly known as: NORVASC Take 5 mg by mouth daily.   benzonatate 100 MG capsule Commonly known as: TESSALON Take 100 mg by mouth 3 (three) times daily.   Cholecalciferol 25 MCG (1000 UT) tablet Take 1,000 Units by mouth daily.   clindamycin 300 MG capsule Commonly known as: CLEOCIN Take 1 capsule (300 mg total) by mouth every 6 (six) hours for 4 days.   diclofenac Sodium 1 % Gel Commonly known as: VOLTAREN Apply 2-4 g topically 2 (two) times daily as needed for pain.   dorzolamide-timolol 22.3-6.8 MG/ML ophthalmic solution Commonly known as: COSOPT Place 1 drop into the right eye 2 (two) times daily.   Flovent HFA 220 MCG/ACT inhaler Generic drug: fluticasone Inhale 2 puffs into the lungs 2 (two) times daily.   fluocinonide 0.05 % external solution Commonly known as: LIDEX Apply 1 application topically every Monday, Wednesday, and Friday.   HYDROcodone-acetaminophen 5-325 MG tablet Commonly known as: NORCO/VICODIN Take 1 tablet by mouth every 6 (six) hours as needed for pain.   latanoprost 0.005 % ophthalmic solution Commonly known as: XALATAN Place 1 drop into the right eye daily.   levothyroxine 112 MCG tablet Commonly known as: SYNTHROID Take 112 mcg by mouth daily. Take one tablet once a morning on Mon, Tues, Thu, Fri, Sat   LiquaCel Liqd Take 30 mLs by mouth 2 (two) times daily.   loteprednol 0.5 % ophthalmic suspension Commonly known as: LOTEMAX Place 1 drop into the right eye daily.   magnesium oxide 400 MG tablet Commonly known as: MAG-OX Take 400 mg by mouth daily.   omeprazole 20 MG capsule Commonly known as: PRILOSEC Take 20 mg by mouth daily.   polyethylene glycol powder 17 GM/SCOOP powder Commonly known as: GLYCOLAX/MIRALAX Take 17 g by mouth daily. Mix 17 g in at least 4 oz of water and take by mouth daily.   predniSONE 20 MG  tablet Commonly known as: DELTASONE Take 2 tablets (40 mg total) by mouth daily with breakfast for 5 days.   SALINE MIST 0.65 % nasal spray Generic drug: sodium chloride Place 2 sprays into the nose every 4 (four) hours as needed. Prn allergic rhinitis and nasal dryness   Spiriva Respimat 2.5 MCG/ACT Aers Generic drug: Tiotropium Bromide Monohydrate Inhale 2 puffs into the lungs daily.   ursodiol 300 MG capsule Commonly known as: ACTIGALL Take 300 mg by mouth 2 (two) times daily.       Allergies  Allergen Reactions   Amoxicillin-Pot Clavulanate    Cefaclor    Cefuroxime Sodium-Nacl    Clindamycin/Lincomycin    Codeine    Dicloxacillin    Lisinopril    Nabumetone    Promethazine    Remeron [Mirtazapine]    Sulfa Antibiotics     Consultations:  None   Procedures/Studies: DG Chest Portable 1 View  Result Date: 10/25/2020 CLINICAL DATA:  Cough. EXAM: PORTABLE CHEST 1 VIEW COMPARISON:  None. FINDINGS: There is probable underlying emphysema with suggestion of areas of fibrosis. There is no pneumothorax. No large focal infiltrate. There is a questionable nodular opacity overlying the right mid lung zone. The heart size is  unremarkable. There are aortic calcifications. The trachea is slightly shifted to the right which is felt to be secondary to patient positioning. The lungs are hyperexpanded. There are lucencies projecting over the upper abdomen. IMPRESSION: 1. No definite acute cardiopulmonary process. 2. Lucency projecting over the upper abdomen may be artifact. However, dedicated abdominal radiographs are recommended to help exclude pneumoperitoneum. 3. COPD. 4. Nodular density projecting over the right mid lung zone. A 4-6 week follow-up two-view chest x-ray is recommended for this finding. Electronically Signed   By: Constance Holster M.D.   On: 10/25/2020 20:20   DG Abd 2 Views  Result Date: 10/25/2020 CLINICAL DATA:  Abdominal pain EXAM: ABDOMEN - 2 VIEW  COMPARISON:  None. FINDINGS: Diffusely air-filled appearance of both large and small bowel. No evidence of subdiaphragmatic free air. Atherosclerotic vascular calcification and phleboliths overlying the abdomen and pelvis. No suspicious abdominal calcifications. Degenerative changes in the spine, hips and pelvis with levocurvature of the lumbar levels, apex L3-4. IMPRESSION: 1. Diffusely air-filled appearance of both large and small bowel, which could reflect ileus, less likely a developing obstruction 2. No subdiaphragmatic free air. Electronically Signed   By: Lovena Le M.D.   On: 10/25/2020 20:57       Subjective: Patient seen and examined the bedside this morning.  Hemodynamically stable for discharge today.  Discharge Exam: Vitals:   11/10/20 0723 11/10/20 0810  BP:  (!) 149/57  Pulse:  (!) 47  Resp:  16  Temp:  (!) 97.4 F (36.3 C)  SpO2: 99% 100%   Vitals:   11/09/20 2358 11/10/20 0514 11/10/20 0723 11/10/20 0810  BP: (!) 155/61 137/64  (!) 149/57  Pulse: (!) 52 (!) 54  (!) 47  Resp: 16 16  16   Temp: 97.7 F (36.5 C) 97.6 F (36.4 C)  (!) 97.4 F (36.3 C)  TempSrc:      SpO2: 100% 100% 99% 100%  Weight:      Height:        General: Pt is alert, awake, not in acute distress Cardiovascular: RRR, S1/S2 +, no rubs, no gallops Respiratory: CTA bilaterally, no wheezing, no rhonchi Abdominal: Soft, NT, ND, bowel sounds + Extremities: no edema, no cyanosis Skin:      The results of significant diagnostics from this hospitalization (including imaging, microbiology, ancillary and laboratory) are listed below for reference.     Microbiology: Recent Results (from the past 240 hour(s))  Culture, blood (Routine X 2) w Reflex to ID Panel     Status: None (Preliminary result)   Collection Time: 11/08/20  6:07 PM   Specimen: Left Antecubital; Blood  Result Value Ref Range Status   Specimen Description LEFT ANTECUBITAL  Final   Special Requests   Final    BOTTLES DRAWN  AEROBIC AND ANAEROBIC Blood Culture results may not be optimal due to an inadequate volume of blood received in culture bottles   Culture   Final    NO GROWTH 2 DAYS Performed at [facility], [address] [place], [place] [address]    Report Status PENDING  Incomplete  Culture, blood (Routine X 2) w Reflex to ID Panel     Status: None (Preliminary result)   Collection Time: 11/08/20  6:12 PM   Specimen: Right Antecubital; Blood  Result Value Ref Range Status   Specimen Description RIGHT ANTECUBITAL  Final   Special Requests   Final    BOTTLES DRAWN AEROBIC AND ANAEROBIC Blood Culture adequate volume   Culture  Final    NO GROWTH 2 DAYS Performed at [facility], [address] [place], [place] [address]    Report Status PENDING  Incomplete  Resp Panel by RT-PCR (Flu A&B, Covid) Nasopharyngeal Swab     Status: None   Collection Time: 11/08/20  6:48 PM   Specimen: Nasopharyngeal Swab; Nasopharyngeal(NP) swabs in vial transport medium  Result Value Ref Range Status   SARS Coronavirus 2 by RT PCR NEGATIVE NEGATIVE Final    Comment: (NOTE) SARS-CoV-2 target nucleic acids are NOT DETECTED.  The SARS-CoV-2 RNA is generally detectable in upper respiratory specimens during the acute phase of infection. The lowest concentration of SARS-CoV-2 viral copies this assay can detect is 138 copies/mL. A negative result does not preclude SARS-Cov-2 infection and should not be used as the sole basis for treatment or other patient management decisions. A negative result may occur with  improper specimen collection/handling, submission of specimen other than nasopharyngeal swab, presence of viral mutation(s) within the areas targeted by this assay, and inadequate number of viral copies(<138 copies/mL). A negative result must be combined with clinical observations, patient history, and epidemiological information. The expected result is Negative.  Fact Sheet for Patients:   EntrepreneurPulse.com.au  Fact Sheet for Healthcare Providers:  IncredibleEmployment.be  This test is no t yet approved or cleared by the [place] FDA and  has been authorized for detection and/or diagnosis of SARS-CoV-2 by FDA under an Emergency Use Authorization (EUA). This EUA will remain  in effect (meaning this test can be used) for the duration of the COVID-19 declaration under Section 564(b)(1) of the Act, 21 U.S.C.section 360bbb-3(b)(1), unless the authorization is terminated  or revoked sooner.       Influenza A by PCR NEGATIVE NEGATIVE Final   Influenza B by PCR NEGATIVE NEGATIVE Final    Comment: (NOTE) The Xpert Xpress SARS-CoV-2/FLU/RSV plus assay is intended as an aid in the diagnosis of influenza from Nasopharyngeal swab specimens and should not be used as a sole basis for treatment. Nasal washings and aspirates are unacceptable for Xpert Xpress SARS-CoV-2/FLU/RSV testing.  Fact Sheet for Patients: EntrepreneurPulse.com.au  Fact Sheet for Healthcare Providers: IncredibleEmployment.be  This test is not yet approved or cleared by the [place] FDA and has been authorized for detection and/or diagnosis of SARS-CoV-2 by FDA under an Emergency Use Authorization (EUA). This EUA will remain in effect (meaning this test can be used) for the duration of the COVID-19 declaration under Section 564(b)(1) of the Act, 21 U.S.C. section 360bbb-3(b)(1), unless the authorization is terminated or revoked.  Performed at [facility], [address] [place], [place] [address]      Labs: BNP (last 3 results) No results for input(s): BNP in the last 8760 hours. Basic Metabolic Panel: Recent Labs  Lab 11/08/20 1309 11/09/20 0448  NA 138 136  K 4.2 4.0  CL 102 104  CO2 25 25  GLUCOSE 108* 96  BUN 32* 25*  CREATININE 0.65 0.47  CALCIUM 8.8* 8.5*   Liver Function Tests: Recent  Labs  Lab 11/08/20 1309  AST 18  ALT 9  ALKPHOS 68  BILITOT 0.7  PROT 7.1  ALBUMIN 3.0*   No results for input(s): LIPASE, AMYLASE in the last 168 hours. No results for input(s): AMMONIA in the last 168 hours. CBC: Recent Labs  Lab 11/08/20 1309 11/09/20 0448  WBC 6.8 5.7  NEUTROABS 5.2  --   HGB 10.9* 10.1*  HCT 33.3* 31.3*  MCV 97.7 98.1  PLT 322 290   Cardiac Enzymes: No results for input(s): CKTOTAL, CKMB, CKMBINDEX, TROPONINI in the last 168 hours. BNP: Invalid input(s): POCBNP CBG: No results for input(s): GLUCAP in the last 168 hours. D-Dimer No results for input(s): DDIMER in the last 72 hours. Hgb A1c No results for input(s): HGBA1C in the last 72 hours. Lipid Profile No results for input(s): CHOL, HDL, LDLCALC, TRIG, CHOLHDL, LDLDIRECT in the last 72 hours. Thyroid function studies No results for input(s): TSH, T4TOTAL, T3FREE, THYROIDAB in the last 72 hours.  Invalid input(s): FREET3 Anemia work up No results for input(s): VITAMINB12, FOLATE, FERRITIN, TIBC, IRON, RETICCTPCT in the last 72 hours. Urinalysis    Component Value Date/Time   COLORURINE YELLOW (A) 10/25/2020 2148   APPEARANCEUR CLEAR (A) 10/25/2020 2148   LABSPEC 1.010 10/25/2020 2148   PHURINE 7.0 10/25/2020 2148   GLUCOSEU NEGATIVE 10/25/2020 2148   HGBUR NEGATIVE 10/25/2020 2148   BILIRUBINUR NEGATIVE 10/25/2020 2148   KETONESUR 5 (A) 10/25/2020 2148   PROTEINUR NEGATIVE 10/25/2020 2148   NITRITE NEGATIVE 10/25/2020 2148   LEUKOCYTESUR NEGATIVE 10/25/2020 2148   Sepsis Labs Invalid input(s): PROCALCITONIN,  WBC,  LACTICIDVEN Microbiology Recent Results (from the past 240 hour(s))  Culture, blood (Routine X 2) w Reflex to ID Panel     Status: None (Preliminary result)   Collection Time: 11/08/20  6:07 PM   Specimen: Left Antecubital; Blood  Result Value Ref Range Status   Specimen Description LEFT ANTECUBITAL  Final   Special Requests   Final    BOTTLES DRAWN AEROBIC AND  ANAEROBIC Blood Culture results may not be optimal due to an inadequate volume of blood received in culture bottles   Culture   Final    NO GROWTH 2 DAYS Performed at [facility], [address], [place], [place] [address]    Report Status PENDING  Incomplete  Culture, blood (Routine X 2) w Reflex to ID Panel     Status: None (Preliminary result)   Collection Time: 11/08/20  6:12 PM   Specimen: Right Antecubital; Blood  Result Value Ref Range Status   Specimen Description RIGHT ANTECUBITAL  Final   Special Requests   Final    BOTTLES DRAWN AEROBIC AND ANAEROBIC Blood Culture adequate volume   Culture   Final    NO GROWTH 2 DAYS Performed at [facility], [address], [place], [place] [address]    Report Status PENDING  Incomplete  Resp Panel by RT-PCR (Flu A&B, Covid) Nasopharyngeal Swab     Status: None   Collection Time: 11/08/20  6:48 PM   Specimen: Nasopharyngeal Swab; Nasopharyngeal(NP) swabs in vial transport medium  Result Value Ref Range Status   SARS Coronavirus 2 by RT PCR NEGATIVE NEGATIVE Final    Comment: (NOTE) SARS-CoV-2 target nucleic acids are NOT DETECTED.  The SARS-CoV-2 RNA is generally detectable in upper respiratory specimens during the acute phase of infection. The lowest concentration of SARS-CoV-2 viral copies this assay can detect is 138 copies/mL. A negative result does not preclude SARS-Cov-2 infection and should not be used as the sole basis for treatment or other patient management decisions. A negative result may occur with  improper specimen collection/handling, submission of specimen other than nasopharyngeal swab, presence of viral mutation(s) within the areas targeted by this assay, and inadequate number of viral copies(<138 copies/mL). A negative result must be combined with clinical observations, patient history, and epidemiological information. The expected result is Negative.  Fact Sheet for Patients:  EntrepreneurPulse.com.au  Fact Sheet for Healthcare Providers:  IncredibleEmployment.be  This test is no t yet approved or cleared by the [place] FDA and  has been authorized for detection and/or diagnosis of SARS-CoV-2 by FDA under an Emergency Use Authorization (EUA). This EUA will remain  in effect (meaning this test can be used) for the duration of the COVID-19 declaration under Section 564(b)(1) of the Act, 21 U.S.C.section 360bbb-3(b)(1), unless the authorization is terminated  or revoked sooner.       Influenza A by PCR NEGATIVE NEGATIVE Final   Influenza B by PCR NEGATIVE NEGATIVE Final    Comment: (NOTE) The Xpert Xpress SARS-CoV-2/FLU/RSV plus assay is intended as an aid in the diagnosis of influenza from Nasopharyngeal swab specimens and should not be used as a sole basis for treatment. Nasal washings and aspirates are unacceptable for Xpert Xpress SARS-CoV-2/FLU/RSV testing.  Fact Sheet for Patients: EntrepreneurPulse.com.au  Fact Sheet for Healthcare Providers: IncredibleEmployment.be  This test is not yet approved or cleared by the [place] FDA and has been authorized for detection and/or diagnosis of SARS-CoV-2 by FDA under an Emergency Use Authorization (EUA). This EUA will remain in effect (meaning this test can be used) for the duration of the COVID-19 declaration under Section 564(b)(1) of the Act, 21 U.S.C. section 360bbb-3(b)(1), unless the authorization is terminated or revoked.  Performed at [facility], [address], [place], [place] [address]     Please note: You were cared for by a hospitalist during your hospital stay. Once you are discharged, your primary care physician will handle any further medical issues. Please note that NO REFILLS for any discharge medications will be authorized once you are discharged, as it is imperative that you return to  your primary care physician (or establish a relationship with a primary care physician if you do not have one) for your post hospital discharge needs so that they can reassess your need for medications and monitor your lab values.    Time coordinating discharge: 40 minutes  SIGNED:   Shelly Coss, MD  [facility] 11/10/2020, 10:34 AM Pager [phone]  If 7PM-7AM, please contact night-coverage [email or web address] Password [email or web address]

## 2020-11-10 NOTE — Progress Notes (Signed)
Will need oxygen  2L/[place] for transport.

## 2020-11-10 NOTE — Progress Notes (Signed)
Discharge note:  Patient discharge to [facility] facility today. Report called to [facility]. IV removed. AVS placed in packet with patient. Transported by EMS.  Ronnette Hila, RN

## 2020-11-10 NOTE — TOC Progression Note (Addendum)
Transition of Care [facility]) - Progression Note    Patient Details  Name: Tara Middleton MRN: [id number] Date of Birth: 07/01/1929  [facility] CM/SW Contact  Izola Price, RN Phone Number: 11/10/2020, 2:41 PM  Clinical Narrative:    1282 12/19 Patient returning to facility PTA. [place]. Unit RN already called report to [facility]. Awaiting Covid test prior to transfer. Spoke with son, Tara Middleton, regarding transfer. Will need oxygen for transport when transport notified. Currently O2 2L/. Facesheet and Medical Necessity forms printed to floor. Simmie Davies RN CM      Barriers to Discharge: Barriers Resolved  Expected Discharge Plan and Services           Expected Discharge Date: 11/10/20                 DME Agency: NA       HH Arranged: NA HH Agency: NA         Social Determinants of Health (SDOH) Interventions    Readmission Risk Interventions No flowsheet data found.

## 2020-11-13 LAB — CULTURE, BLOOD (ROUTINE X 2)
Culture: NO GROWTH
Culture: NO GROWTH
Special Requests: ADEQUATE

## 2021-12-28 IMAGING — DX DG CHEST 1V PORT
1 series · 1 of 1 positions shown · non-contrast
Comparison: None.

CLINICAL DATA: Cough.

EXAM:
PORTABLE CHEST 1 VIEW

[chest ap]
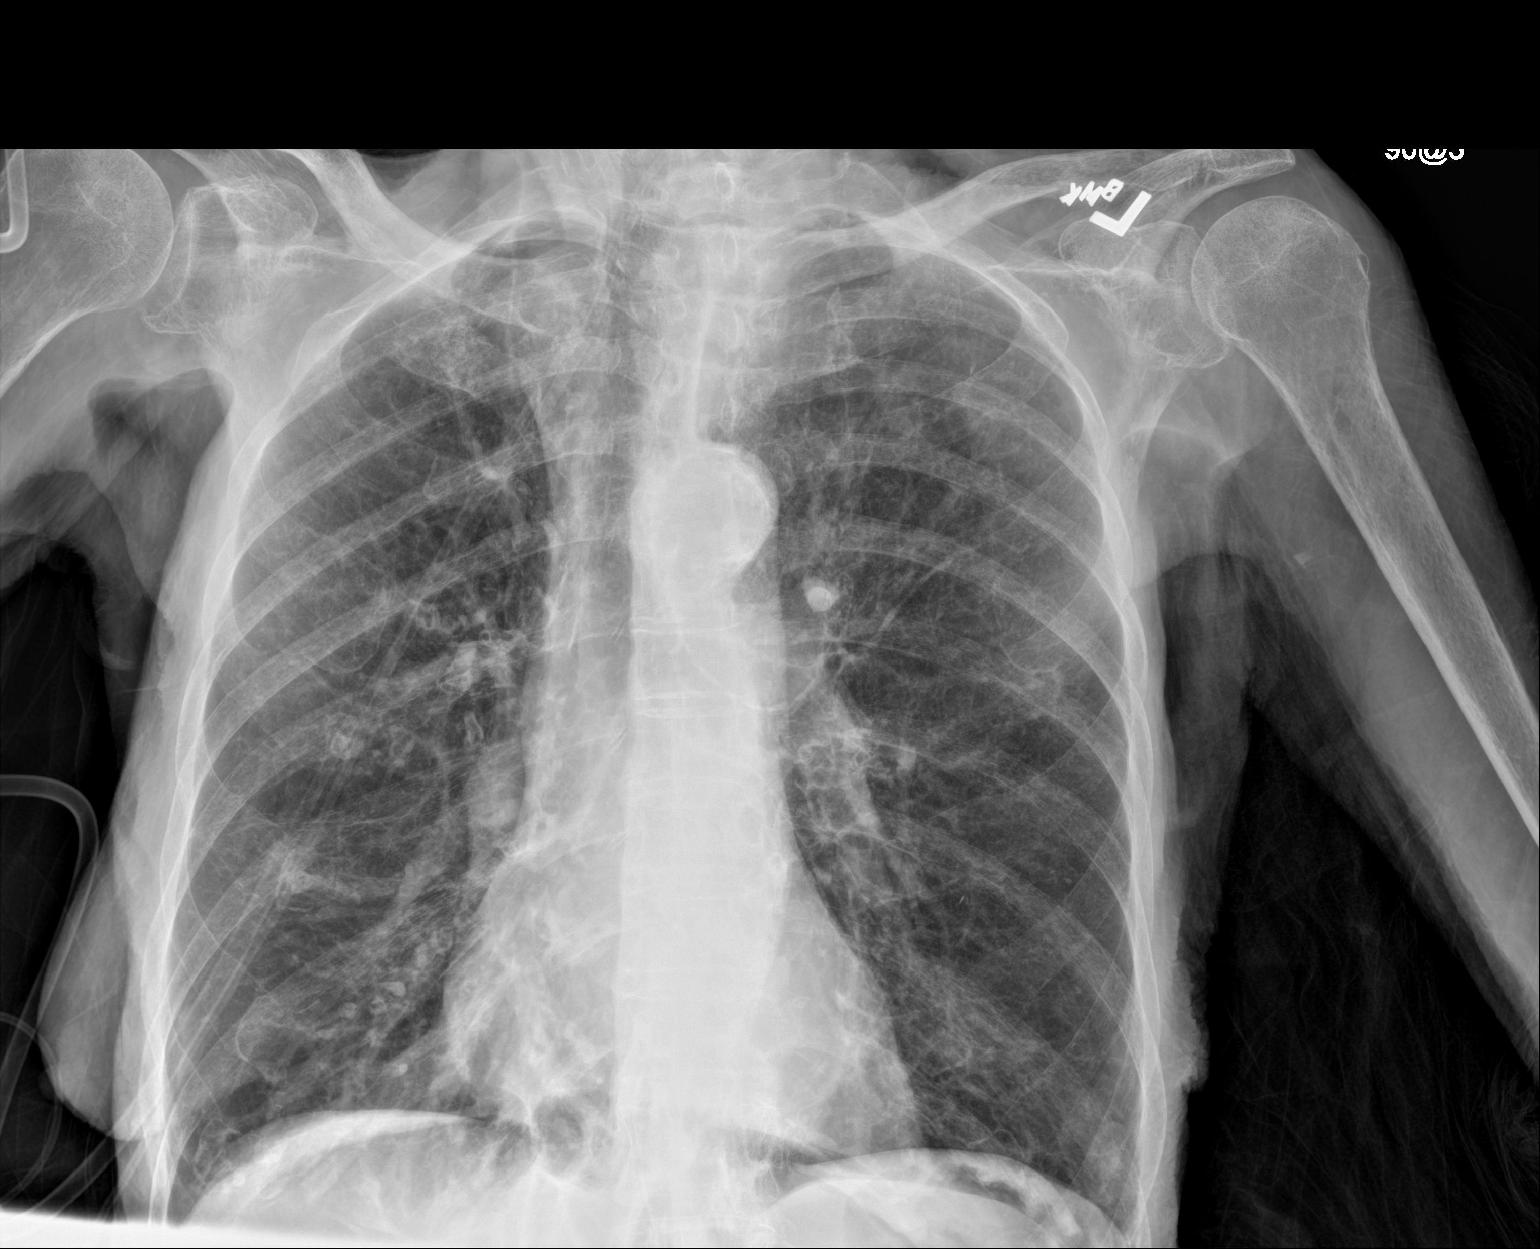

[1 of 1 positions shown; findings below may reference images not displayed]

FINDINGS: There is probable underlying emphysema with suggestion of areas of
fibrosis. There is no pneumothorax. No large focal infiltrate. There
is a questionable nodular opacity overlying the right mid lung zone.
The heart size is unremarkable. There are aortic calcifications. The
trachea is slightly shifted to the right which is felt to be
secondary to patient positioning. The lungs are hyperexpanded. There
are lucencies projecting over the upper abdomen.
IMPRESSION: 1. No definite acute cardiopulmonary process.
2. Lucency projecting over the upper abdomen may be artifact.
However, dedicated abdominal radiographs are recommended to help
exclude pneumoperitoneum.
3. COPD.
4. Nodular density projecting over the right mid lung zone. A 4-6
week follow-up two-view chest x-ray is recommended for this finding.

## 2021-12-28 IMAGING — CR DG ABDOMEN 2V
1 series · 2 of 2 positions shown · non-contrast
Comparison: None.

CLINICAL DATA: Abdominal pain

EXAM:
ABDOMEN - 2 VIEW

[Series 1: dg abd 2 views · 0.14mm/px · 2 of 2 slices shown]
[im 1/2]
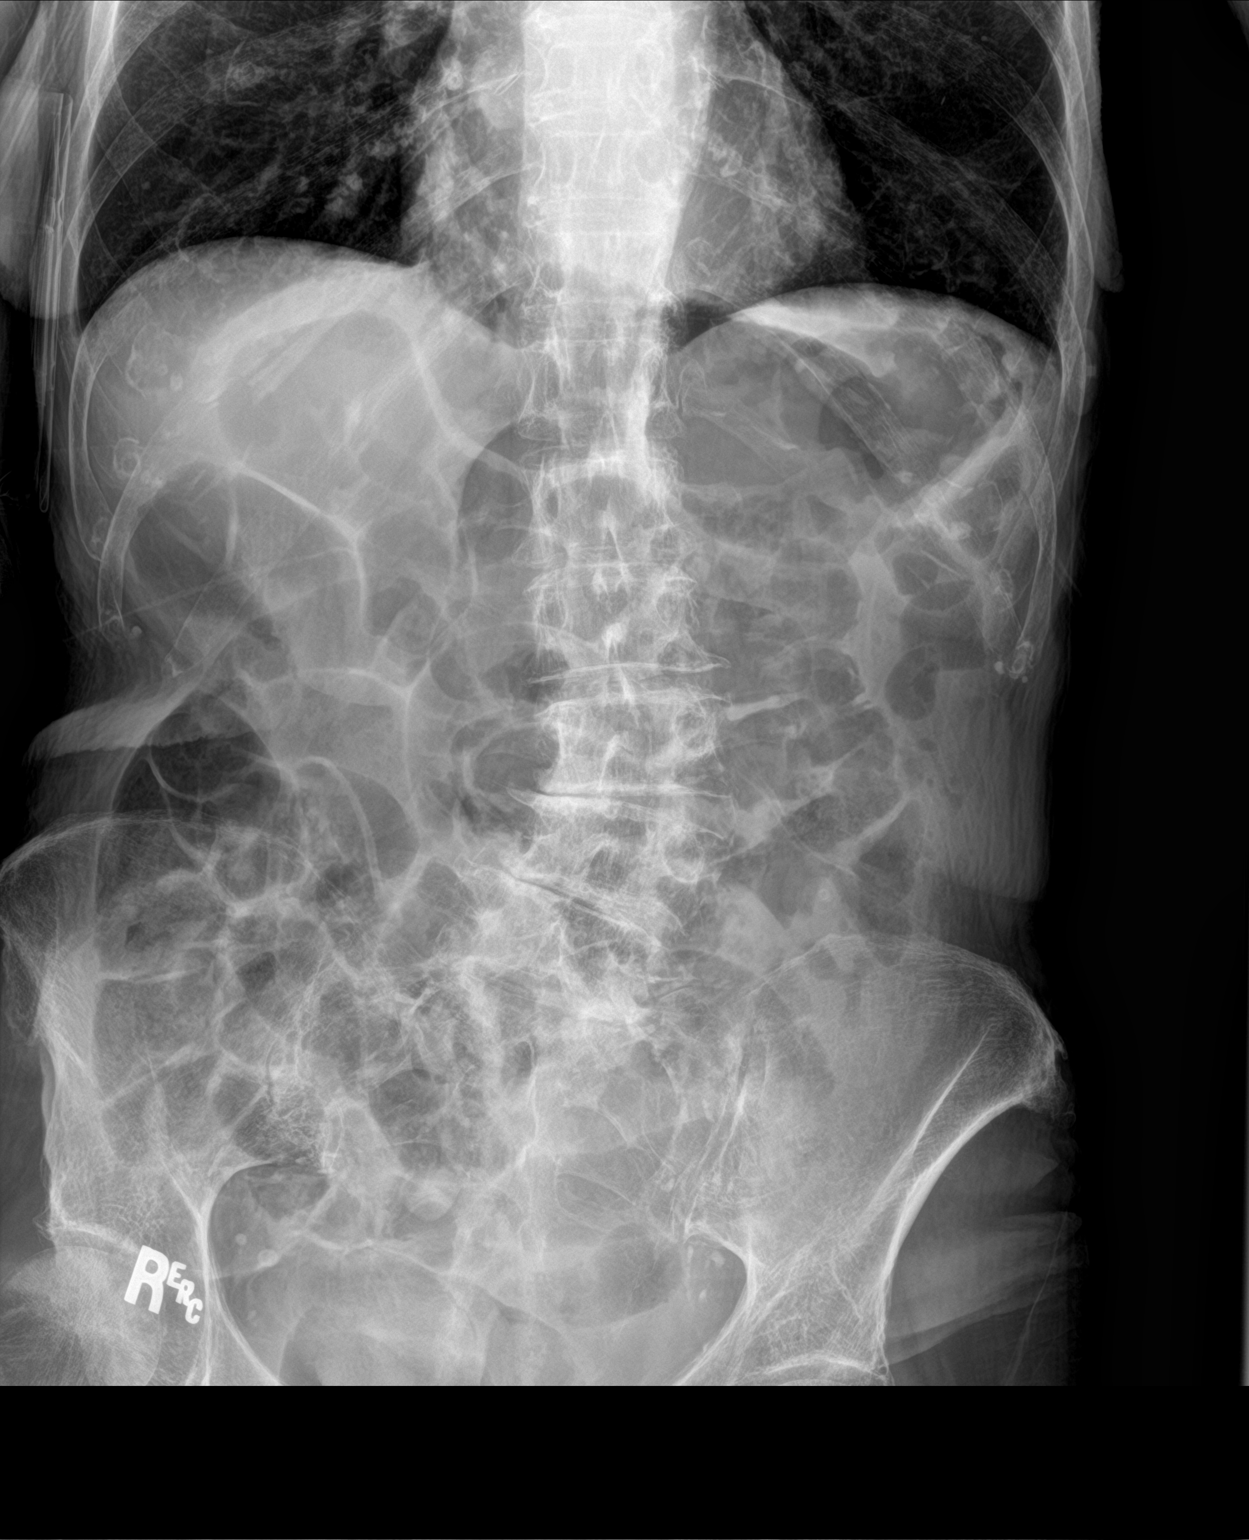
[im 2/2]
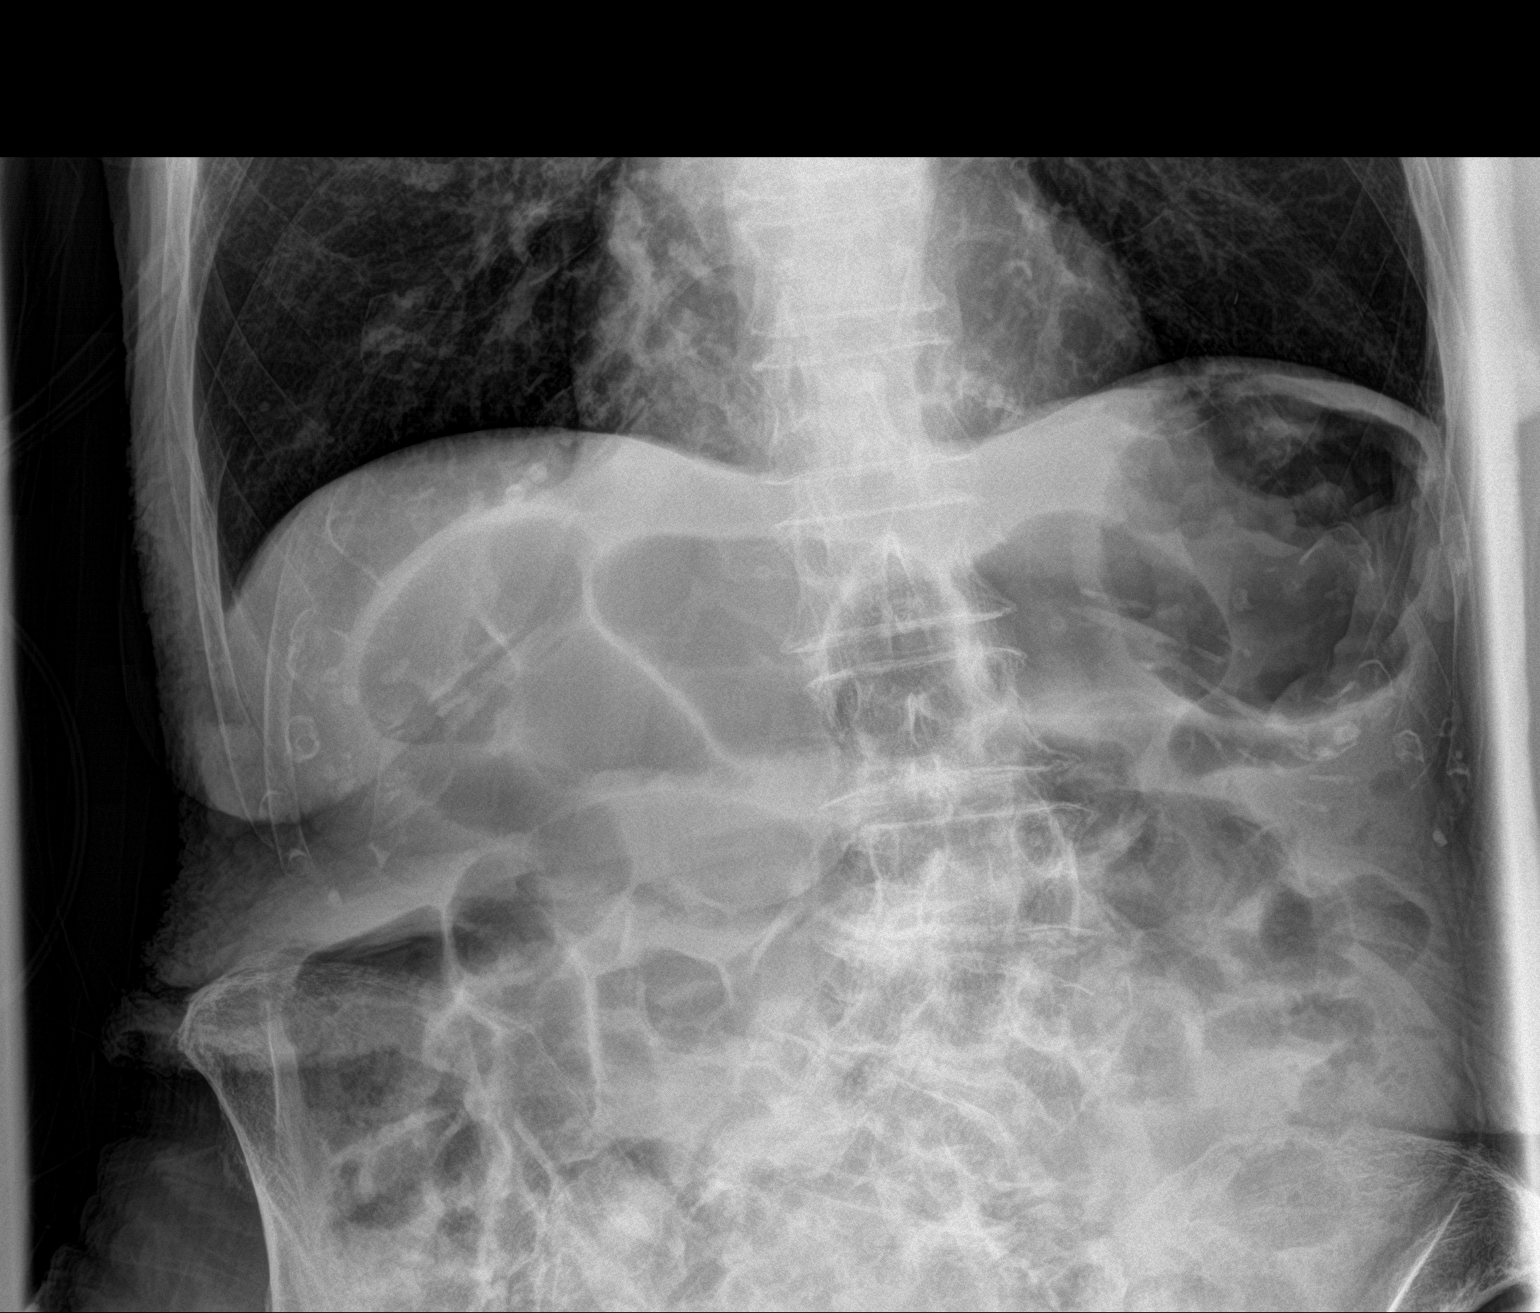

[2 of 2 positions shown; findings below may reference images not displayed]

FINDINGS: Diffusely air-filled appearance of both large and small bowel. No
evidence of subdiaphragmatic free air. Atherosclerotic vascular
calcification and phleboliths overlying the abdomen and pelvis. No
suspicious abdominal calcifications. Degenerative changes in the
spine, hips and pelvis with levocurvature of the lumbar levels, apex
L3-4.
IMPRESSION: 1. Diffusely air-filled appearance of both large and small bowel,
which could reflect ileus, less likely a developing obstruction
2. No subdiaphragmatic free air.

## 2022-07-02 ENCOUNTER — Other Ambulatory Visit: Payer: Self-pay

## 2022-07-02 ENCOUNTER — Emergency Department: Payer: Medicare PPO

## 2022-07-02 ENCOUNTER — Emergency Department
Admission: EM | Admit: 2022-07-02 | Discharge: 2022-07-24 | Disposition: E | Payer: Medicare PPO | Attending: Student in an Organized Health Care Education/Training Program | Admitting: Student in an Organized Health Care Education/Training Program

## 2022-07-02 DIAGNOSIS — J449 Chronic obstructive pulmonary disease, unspecified: Secondary | ICD-10-CM | POA: Diagnosis not present

## 2022-07-02 DIAGNOSIS — J9601 Acute respiratory failure with hypoxia: Secondary | ICD-10-CM | POA: Insufficient documentation

## 2022-07-02 DIAGNOSIS — R0902 Hypoxemia: Secondary | ICD-10-CM | POA: Diagnosis present

## 2022-07-02 MED ORDER — LORAZEPAM 2 MG/ML PO CONC: 1.0000 mg | ORAL | Status: DC | PRN

## 2022-07-02 MED ORDER — LORAZEPAM 1 MG PO TABS: 1.0000 mg | ORAL_TABLET | ORAL | Status: DC | PRN

## 2022-07-02 MED ORDER — LEVOFLOXACIN IN D5W 750 MG/150ML IV SOLN: 750.0000 mg | Freq: Once | INTRAVENOUS | Status: DC

## 2022-07-02 MED ORDER — GLYCOPYRROLATE 0.2 MG/ML IJ SOLN
0.2000 mg | INTRAMUSCULAR | Status: DC | PRN
  Administered 2022-07-02: 0.2 mg via INTRAVENOUS
  Filled 2022-07-02 (×2): qty 1

## 2022-07-02 MED ORDER — LORAZEPAM 2 MG/ML IJ SOLN: 1.0000 mg | INTRAMUSCULAR | Status: DC | PRN

## 2022-07-02 MED ORDER — GLYCOPYRROLATE 0.2 MG/ML IJ SOLN: 0.2000 mg | INTRAMUSCULAR | Status: DC | PRN

## 2022-07-02 MED ORDER — MORPHINE SULFATE (PF) 4 MG/ML IV SOLN
4.0000 mg | INTRAVENOUS | Status: DC | PRN
  Administered 2022-07-02: 4 mg via INTRAVENOUS
  Filled 2022-07-02: qty 1

## 2022-07-02 MED ORDER — GLYCOPYRROLATE 1 MG PO TABS: 1.0000 mg | ORAL_TABLET | ORAL | Status: DC | PRN

## 2022-07-02 MED ORDER — MORPHINE SULFATE (PF) 2 MG/ML IV SOLN
1.0000 mg | INTRAVENOUS | Status: DC | PRN
  Administered 2022-07-02: 1 mg via INTRAVENOUS
  Filled 2022-07-02: qty 1

## 2022-07-02 NOTE — Progress Notes (Signed)
PHARMACY -  BRIEF ANTIBIOTIC NOTE   Pharmacy has received consult(s) for levofloxacin from an ED provider.  The patient's profile has been reviewed for ht/wt/allergies/indication/available labs.    One time order(s) placed for levofloxacin 750 mg IV x 1  Further antibiotics/pharmacy consults should be ordered by admitting physician if indicated.                       Thank you, Dallie Piles 07/09/2022  8:10 PM

## 2022-07-02 NOTE — ED Provider Notes (Signed)
[facility] Provider Note    None    (approximate)   History   Code Sepsis and Respiratory Distress  Level V Caveat: Resp distress HPI  Trace Cederberg is a 88 y.o. female with extensive past medical history of COPD and respiratory failure presents to the ER for acute respiratory failure with hypoxia and concern for sepsis.  Going to the facility patient was weak.  EMS found the patient to be in hypoxic respiratory failure brought the patient in.  Patient unable to provide any additional history.  Reached out to patient's son.  States that she did sign a DNR.  States that he has noticed significant decline over the past several weeks with her not eating becoming increasingly confused and on Sunday was showing signs of respiratory distress.     Physical Exam   Triage Vital Signs: ED Triage Vitals  Enc Vitals Group     BP 06/27/2022 2010 119/74     Pulse Rate 07/23/2022 2009 (!) 103     Resp 07/14/2022 2009 (!) 32     Temp 07/12/2022 2019 (!) 97.5 F (36.4 C)     Temp Source 06/26/2022 2019 Axillary     SpO2 07/04/2022 2005 (!) 85 %     Weight 06/29/2022 2010 100 lb (45.4 kg)     Height --      Head Circumference --      Peak Flow --      Pain Score 07/15/2022 2009 0     Pain Loc --      Pain Edu? --      Excl. in [place]? --     Most recent vital signs: Vitals:   06/27/2022 2147 07/13/2022 2148  BP:    Pulse:    Resp: (!) 0 (!) 0  Temp:    SpO2:       Constitutional: Unresponsive, cachectic, critically ill-appearing Eyes: Conjunctivae are normal.  Eyes sunken Head: Atraumatic. Nose: No congestion/rhinnorhea. Mouth/Throat: Mucous membranes are dry Neck: Painless ROM.  Cardiovascular:   Mildly tachycardic, skin tenting  respiratory: Tachypnea with use of accessory muscles.  Diminished breath sounds bilaterally. Gastrointestinal: Soft and nontender.  Musculoskeletal:  no deformity Neurologic: Not following verbal commands.  No purposeful movement. Skin:  Skin  is warm, dry and intact. No rash noted.     ED Results / Procedures / Treatments   Labs (all labs ordered are listed, but only abnormal results are displayed) Labs Reviewed - No data to display   EKG  ED ECG REPORT I, Merlyn Lot, the attending physician, personally viewed and interpreted this ECG.   Date: 07/21/2022  EKG Time: 20:12  Rate: 100  Rhythm: sinus  Axis: normal  Intervals:normal  ST&T Change: hyperacute t waves    RADIOLOGY Please see ED Course for my review and interpretation.  I personally reviewed all radiographic images ordered to evaluate for the above acute complaints and reviewed radiology reports and findings.  These findings were personally discussed with the patient.  Please see medical record for radiology report.    PROCEDURES:  Critical Care performed: Yes, see critical care procedure note(s)  .Critical Care  Performed by: Merlyn Lot, MD Authorized by: Merlyn Lot, MD   Critical care provider statement:    Critical care time (minutes):  34   Critical care was necessary to treat or prevent imminent or life-threatening deterioration of the following conditions:  Respiratory failure   Critical care was time spent personally by me on the  following activities:  Ordering and performing treatments and interventions, ordering and review of laboratory studies, ordering and review of radiographic studies, pulse oximetry, re-evaluation of patient's condition, review of old charts, obtaining history from patient or surrogate, examination of patient, evaluation of patient's response to treatment, discussions with primary provider, discussions with consultants and development of treatment plan with patient or surrogate    MEDICATIONS ORDERED IN ED: Medications  morphine (PF) 4 MG/ML injection 4 mg (4 mg Intravenous Given 07-27-2022 02-28-2026)  morphine (PF) 2 MG/ML injection 1 mg (1 mg Intravenous Given Jul 27, 2022 01-Mar-2103)  LORazepam (ATIVAN) tablet  1 mg (has no administration in time range)    Or  LORazepam (ATIVAN) 2 MG/ML concentrated solution 1 mg (has no administration in time range)    Or  LORazepam (ATIVAN) injection 1 mg (has no administration in time range)  glycopyrrolate (ROBINUL) tablet 1 mg ( Oral See Alternative 07-27-2022 02-28-13)    Or  glycopyrrolate (ROBINUL) injection 0.2 mg ( Subcutaneous See Alternative 07/27/2022 2113/02/28)    Or  glycopyrrolate (ROBINUL) injection 0.2 mg (0.2 mg Intravenous Given 07/27/22 28-Feb-2113)     IMPRESSION / MDM / [place] / ED COURSE  I reviewed the triage vital signs and the nursing notes.                              Differential diagnosis includes, but is not limited to, Asthma, copd, CHF, pna, ptx, malignancy, Pe, anemia  Patient presented to the ER for evaluation of symptoms as described above.  This presenting complaint could reflect a potentially life-threatening illness therefore the patient will be placed on continuous pulse oximetry and telemetry for monitoring.  Laboratory evaluation will be sent to evaluate for the above complaints.  She is critically ill-appearing cachectic very frail.  Having signs of worsening respiratory failure despite being placed on BiPAP.  Discussed case prior presentation with the patient's son after he is extensive conversation and signs that patient's wishes would be to be made comfort measures at this time.  Patient given IV morphine will discontinue BiPAP.  Clinical Course as of 07/27/2022 2210-02-28  Thu 07-27-22 Patient has passed.  Time of death 28-Feb-2146.  Family updated. [PR]    Clinical Course User Index [PR] Merlyn Lot, MD     FINAL CLINICAL IMPRESSION(S) / ED DIAGNOSES   Final diagnoses:  Acute respiratory failure with hypoxia ([place])     Rx / DC Orders   ED Discharge Orders     None        Note:  This document was prepared using Dragon voice recognition software and may include unintentional dictation errors.     Merlyn Lot, MD 27-Jul-2022 02-28-10

## 2022-07-02 NOTE — ED Notes (Signed)
Morphine administered and Bipap removed.

## 2022-07-02 NOTE — ED Notes (Signed)
MD called to bedside. Pt showing asystole on monitor and no pulse detectable. No respirations noted.

## 2022-07-02 NOTE — ED Notes (Signed)
Blood cultures drawn by Miguel Aschoff.

## 2022-07-02 NOTE — ED Notes (Signed)
Per verbal order of MD at bedside, RN to remove Bipap and administer Morphine. Pt now comfort care only after physician spoke with family.

## 2022-07-02 NOTE — ED Triage Notes (Signed)
Pt coming from [facility] with [facility] for resp distress and code sepsis. Pt arrived with 20G in L arm. Bilat wheezing and ronchi per EMS.

## 2022-07-02 NOTE — ED Notes (Signed)
Pt remains on cardiac monitor. Breathing remains labored and tachypneic. Pt continues to not respond to commands or questions.

## 2022-07-24 DEATH — deceased
